# Patient Record
Sex: Male | Born: 1960 | Race: White | Hispanic: No | Marital: Single | Smoking: Never smoker
Health system: Southern US, Community
[De-identification: ages and names within clinical notes are randomized; demographics above are authoritative.]

## PROBLEM LIST (undated history)

## (undated) DIAGNOSIS — I10 Essential (primary) hypertension: Secondary | ICD-10-CM

## (undated) DIAGNOSIS — M1990 Unspecified osteoarthritis, unspecified site: Secondary | ICD-10-CM

## (undated) HISTORY — PX: TONSILLECTOMY: SUR1361

## (undated) HISTORY — PX: WRIST SURGERY: SHX841

---

## 2003-11-04 ENCOUNTER — Emergency Department (HOSPITAL_COMMUNITY): Admission: EM | Admit: 2003-11-04 | Discharge: 2003-11-05 | Payer: Self-pay | Admitting: Emergency Medicine

## 2008-04-23 ENCOUNTER — Encounter: Admission: RE | Admit: 2008-04-23 | Discharge: 2008-04-23 | Payer: Self-pay | Admitting: Podiatry

## 2008-04-23 ENCOUNTER — Encounter: Payer: Self-pay | Admitting: Podiatry

## 2008-05-14 ENCOUNTER — Emergency Department (HOSPITAL_COMMUNITY): Admission: EM | Admit: 2008-05-14 | Discharge: 2008-05-14 | Payer: Self-pay | Admitting: Emergency Medicine

## 2009-11-17 ENCOUNTER — Ambulatory Visit (HOSPITAL_COMMUNITY): Admission: RE | Admit: 2009-11-17 | Discharge: 2009-11-17 | Payer: Self-pay | Admitting: Chiropractic Medicine

## 2009-12-24 ENCOUNTER — Emergency Department (HOSPITAL_COMMUNITY)
Admission: EM | Admit: 2009-12-24 | Discharge: 2009-12-24 | Payer: Self-pay | Source: Home / Self Care | Admitting: Emergency Medicine

## 2010-03-28 ENCOUNTER — Encounter: Payer: Self-pay | Admitting: Podiatry

## 2010-06-17 LAB — POCT I-STAT, CHEM 8
BUN: 21 mg/dL (ref 6–23)
Calcium, Ion: 1.15 mmol/L (ref 1.12–1.32)
Chloride: 106 mEq/L (ref 96–112)
Creatinine, Ser: 1 mg/dL (ref 0.4–1.5)
Glucose, Bld: 108 mg/dL — ABNORMAL HIGH (ref 70–99)
HCT: 47 % (ref 39.0–52.0)
Hemoglobin: 16 g/dL (ref 13.0–17.0)
Potassium: 4 mEq/L (ref 3.5–5.1)
Sodium: 141 mEq/L (ref 135–145)
TCO2: 30 mmol/L (ref 0–100)

## 2010-06-17 LAB — POCT CARDIAC MARKERS
CKMB, poc: 3.3 ng/mL (ref 1.0–8.0)
CKMB, poc: 5.8 ng/mL (ref 1.0–8.0)
Myoglobin, poc: 100 ng/mL (ref 12–200)
Myoglobin, poc: 117 ng/mL (ref 12–200)
Troponin i, poc: <0.05 ng/mL (ref 0.00–0.09)
Troponin i, poc: <0.05 ng/mL (ref 0.00–0.09)

## 2012-11-20 ENCOUNTER — Encounter (HOSPITAL_COMMUNITY): Payer: Self-pay | Admitting: Emergency Medicine

## 2012-11-20 ENCOUNTER — Emergency Department (HOSPITAL_COMMUNITY): Payer: Self-pay

## 2012-11-20 ENCOUNTER — Emergency Department (HOSPITAL_COMMUNITY)
Admission: EM | Admit: 2012-11-20 | Discharge: 2012-11-20 | Disposition: A | Discharge status: Home / Self Care | Payer: Self-pay | Attending: Emergency Medicine | Admitting: Emergency Medicine

## 2012-11-20 DIAGNOSIS — R209 Unspecified disturbances of skin sensation: Secondary | ICD-10-CM | POA: Insufficient documentation

## 2012-11-20 DIAGNOSIS — R0602 Shortness of breath: Secondary | ICD-10-CM | POA: Insufficient documentation

## 2012-11-20 DIAGNOSIS — R42 Dizziness and giddiness: Secondary | ICD-10-CM | POA: Insufficient documentation

## 2012-11-20 DIAGNOSIS — F410 Panic disorder [episodic paroxysmal anxiety] without agoraphobia: Secondary | ICD-10-CM | POA: Insufficient documentation

## 2012-11-20 LAB — CBC WITH DIFFERENTIAL/PLATELET
Basophils Absolute: 0 10*3/uL (ref 0.0–0.1)
Basophils Relative: 0 % (ref 0–1)
Eosinophils Relative: 1 % (ref 0–5)
HCT: 45 % (ref 39.0–52.0)
Hemoglobin: 16.3 g/dL (ref 13.0–17.0)
Lymphocytes Relative: 11 % — ABNORMAL LOW (ref 12–46)
Lymphs Abs: 0.9 10*3/uL (ref 0.7–4.0)
MCH: 33.5 pg (ref 26.0–34.0)
MCHC: 36.2 g/dL — ABNORMAL HIGH (ref 30.0–36.0)
MCV: 92.6 fL (ref 78.0–100.0)
Monocytes Absolute: 0.7 10*3/uL (ref 0.1–1.0)
Neutro Abs: 6.9 10*3/uL (ref 1.7–7.7)
Neutrophils Relative %: 81 % — ABNORMAL HIGH (ref 43–77)
Platelets: 200 10*3/uL (ref 150–400)
RBC: 4.86 MIL/uL (ref 4.22–5.81)
RDW: 14.3 % (ref 11.5–15.5)

## 2012-11-20 LAB — BASIC METABOLIC PANEL
CO2: 25 mEq/L (ref 19–32)
Calcium: 9.5 mg/dL (ref 8.4–10.5)
Chloride: 100 mEq/L (ref 96–112)
Creatinine, Ser: 0.86 mg/dL (ref 0.50–1.35)
GFR calc Af Amer: >90 mL/min (ref >90)
GFR calc non Af Amer: >90 mL/min (ref >90)
Glucose, Bld: 137 mg/dL — ABNORMAL HIGH (ref 70–99)
Potassium: 4 mEq/L (ref 3.5–5.1)
Sodium: 139 mEq/L (ref 135–145)

## 2012-11-20 LAB — D-DIMER, QUANTITATIVE: D-Dimer, Quant: 0.43 ug/mL-FEU (ref 0.00–0.48)

## 2012-11-20 LAB — TROPONIN I: Troponin I: <0.3 ng/mL (ref <0.30)

## 2012-11-20 MED ORDER — LORAZEPAM 2 MG/ML IJ SOLN
1.0000 mg | Freq: Once | INTRAMUSCULAR | Status: AC
  Administered 2012-11-20: 1 mg via INTRAVENOUS
  Filled 2012-11-20: qty 1

## 2012-11-20 MED ORDER — HYDROXYZINE HCL 25 MG PO TABS: 25.0000 mg | ORAL_TABLET | Freq: Four times a day (QID) | ORAL | Status: DC

## 2012-11-20 NOTE — Progress Notes (Signed)
ED CM in to meet with patient to discuss PCP and health insurance issues. Pt presented to ED with panic attack. Pt stated, he does not have a PCP, nor health insurance at this time. Discussed the importance and benefits of having a PCP and a Medical Home. Pt verbalized understanding and agrees. Provided verbal and written information on the [facility]. Information also provided on the [facility] and the benefits and where to apply. Pt verbalized understanding. With patient's permission, will forward information to Barbaraann Boys at [facility] to schedule follow-up visit. Confirmed current phone number with patient of [phone].  Pt urged to contact [facility] if he not contacted within 7 days of discharge, he verbalized understanding.No further CM needs identified.

## 2012-11-20 NOTE — ED Notes (Signed)
Pt brought to ED by EMS with panic attack.As per EMS pt was very anxious and felt that he was going to pass out.

## 2012-11-20 NOTE — ED Provider Notes (Signed)
CSN: [id number]     Arrival date & time 11/20/12  1846 History   First MD Initiated Contact with Patient 11/20/12 1855     Chief Complaint  Patient presents with  . Panic Attack   (Consider location/radiation/quality/duration/timing/severity/associated sxs/prior Treatment) HPI Comments: Patient presents to the ER for evaluation of near syncope from what he thinks was a panic attack. Patient reports that he was at work when symptoms began. Patient reports that he started to feel very anxious and then started to have increased respiratory rate. He tried to continue his job, try to slow his breathing down but was unsuccessful. The patient reports that he had onset of numbness and tingling around his mouth, in his hands and feet. He became lightheaded, dizzy and fell he is going to pass out. He has been trying to drive to the ER, had to pullover and call EMS to bring in the rest of the way to the hospital. Patient denies any chest pain associated with the symptoms. His breathing is much better, but he still feels anxious. Patient reports that he has had previous panic attacks and this feels similar.   History reviewed. No pertinent past medical history. History reviewed. No pertinent past surgical history. No family history on file. History  Substance Use Topics  . Smoking status: Not on file  . Smokeless tobacco: Not on file  . Alcohol Use: Not on file    Review of Systems  Constitutional: Negative for fever.  Respiratory: Positive for shortness of breath.   Cardiovascular: Negative for chest pain.  Psychiatric/Behavioral: The patient is nervous/anxious.   All other systems reviewed and are negative.    Allergies  Review of patient's allergies indicates not on file.  Home Medications  No current outpatient prescriptions on file. BP 169/118  Pulse 102  Temp(Src) 97.8 F (36.6 C) (Oral)  Resp 18  SpO2 96% Physical Exam  Constitutional: He is oriented to person, place, and time.  He appears well-developed and well-nourished. No distress.  HENT:  Head: Normocephalic and atraumatic.  Right Ear: Hearing normal.  Left Ear: Hearing normal.  Nose: Nose normal.  Mouth/Throat: Oropharynx is clear and moist and mucous membranes are normal.  Eyes: Conjunctivae and EOM are normal. Pupils are equal, round, and reactive to light.  Neck: Normal range of motion. Neck supple.  Cardiovascular: Regular rhythm, S1 normal and S2 normal.  Exam reveals no gallop and no friction rub.   No murmur heard. Pulmonary/Chest: Effort normal and breath sounds normal. No respiratory distress. He exhibits no tenderness.  Abdominal: Soft. Normal appearance and bowel sounds are normal. There is no hepatosplenomegaly. There is no tenderness. There is no rebound, no guarding, no tenderness at McBurney's point and negative Murphy's sign. No hernia.  Musculoskeletal: Normal range of motion.  Neurological: He is alert and oriented to person, place, and time. He has normal strength. No cranial nerve deficit or sensory deficit. Coordination normal. GCS eye subscore is 4. GCS verbal subscore is 5. GCS motor subscore is 6.  Skin: Skin is warm, dry and intact. No rash noted. No cyanosis.  Psychiatric: He has a normal mood and affect. His speech is normal and behavior is normal. Thought content normal.    ED Course  Procedures (including critical care time)  EKG:  Date: 11/20/2012  Rate: 109  Rhythm: sinus tachycardia  QRS Axis: normal  Intervals: normal  ST/T Wave abnormalities: nonspecific ST/T changes  Conduction Disutrbances:none  Narrative Interpretation:   Old EKG Reviewed: none available  Labs Review Labs Reviewed  CBC WITH DIFFERENTIAL - Abnormal; Notable for the following:    MCHC 36.2 (*)    Neutrophils Relative % 81 (*)    Lymphocytes Relative 11 (*)    All other components within normal limits  BASIC METABOLIC PANEL - Abnormal; Notable for the following:    Glucose, Bld 137 (*)     BUN 24 (*)    All other components within normal limits  D-DIMER, QUANTITATIVE  TROPONIN I   Imaging Review Dg Chest 2 View  11/20/2012   *RADIOLOGY REPORT*  Clinical Data: Short of breath, panic attack  CHEST - 2 VIEW  Comparison: Prior chest x-ray 05/14/2008  Findings: The cardiac and mediastinal contours within normal limits.  Mild prominence of the interstitial markings appears similar compared to prior.  No focal airspace consolidation, pleural effusion or pneumothorax.  No acute osseous abnormality.  IMPRESSION: No acute cardiopulmonary process.  Stable chest x-ray.   Original Report Authenticated By: Malachy Moan, M.D.    MDM  Diagnosis: Panic attack  Patient presents to the ER for evaluation of shortness of breath, increased respiratory rate, numbness and tingling of the face and hands. Patient's symptoms are consistent with hyperventilation syndrome. He does have a history of panic attacks. Patient was improving at time of arrival. He was still hypertensive. Patient does have Ativan and has had complete resolution of symptoms. Cardiac workup negative. D-dimer negative. The patient will be discharged, [facility].   Gilda Crease, MD 11/20/12 2223

## 2012-11-23 ENCOUNTER — Telehealth: Payer: Self-pay | Admitting: General Practice

## 2013-06-25 NOTE — Telephone Encounter (Signed)
Error

## 2014-10-06 ENCOUNTER — Encounter (HOSPITAL_COMMUNITY): Payer: Self-pay | Admitting: *Deleted

## 2014-10-06 ENCOUNTER — Emergency Department (HOSPITAL_COMMUNITY)
Admission: EM | Admit: 2014-10-06 | Discharge: 2014-10-06 | Disposition: A | Discharge status: Home / Self Care | Payer: 59 | Attending: Emergency Medicine | Admitting: Emergency Medicine

## 2014-10-06 ENCOUNTER — Emergency Department (HOSPITAL_COMMUNITY): Payer: 59

## 2014-10-06 DIAGNOSIS — Z79899 Other long term (current) drug therapy: Secondary | ICD-10-CM | POA: Diagnosis not present

## 2014-10-06 DIAGNOSIS — I10 Essential (primary) hypertension: Secondary | ICD-10-CM | POA: Insufficient documentation

## 2014-10-06 DIAGNOSIS — Z7982 Long term (current) use of aspirin: Secondary | ICD-10-CM | POA: Diagnosis not present

## 2014-10-06 DIAGNOSIS — R202 Paresthesia of skin: Secondary | ICD-10-CM | POA: Diagnosis not present

## 2014-10-06 DIAGNOSIS — M546 Pain in thoracic spine: Secondary | ICD-10-CM | POA: Insufficient documentation

## 2014-10-06 HISTORY — DX: Essential (primary) hypertension: I10

## 2014-10-06 LAB — I-STAT TROPONIN, ED: Troponin i, poc: 0.01 ng/mL (ref 0.00–0.08)

## 2014-10-06 LAB — BASIC METABOLIC PANEL
Anion gap: 10 (ref 5–15)
BUN: 9 mg/dL (ref 6–20)
CHLORIDE: 100 mmol/L — AB (ref 101–111)
CO2: 24 mmol/L (ref 22–32)
Calcium: 8.2 mg/dL — ABNORMAL LOW (ref 8.9–10.3)
Creatinine, Ser: 0.62 mg/dL (ref 0.61–1.24)
GFR calc Af Amer: >60 mL/min (ref >60)
GFR calc non Af Amer: >60 mL/min (ref >60)
GLUCOSE: 113 mg/dL — AB (ref 65–99)
POTASSIUM: 4.3 mmol/L (ref 3.5–5.1)
Sodium: 134 mmol/L — ABNORMAL LOW (ref 135–145)

## 2014-10-06 LAB — CBC
HEMATOCRIT: 43.6 % (ref 39.0–52.0)
HEMOGLOBIN: 14.7 g/dL (ref 13.0–17.0)
MCH: 32.4 pg (ref 26.0–34.0)
MCHC: 33.7 g/dL (ref 30.0–36.0)
MCV: 96 fL (ref 78.0–100.0)
PLATELETS: 167 10*3/uL (ref 150–400)
RBC: 4.54 MIL/uL (ref 4.22–5.81)
RDW: 14 % (ref 11.5–15.5)
WBC: 9 10*3/uL (ref 4.0–10.5)

## 2014-10-06 MED ORDER — CYCLOBENZAPRINE HCL 10 MG PO TABS: 10.0000 mg | ORAL_TABLET | Freq: Two times a day (BID) | ORAL | Status: DC | PRN

## 2014-10-06 MED ORDER — KETOROLAC TROMETHAMINE 60 MG/2ML IM SOLN
30.0000 mg | Freq: Once | INTRAMUSCULAR | Status: AC
  Administered 2014-10-06: 30 mg via INTRAMUSCULAR
  Filled 2014-10-06: qty 2

## 2014-10-06 NOTE — Discharge Instructions (Signed)
Back Pain, Adult Low back pain is very common. About 1 in 5 people have back pain.The cause of low back pain is rarely dangerous. The pain often gets better over time.About half of people with a sudden onset of back pain feel better in just 2 weeks. About 8 in 10 people feel better by 6 weeks.  CAUSES Some common causes of back pain include:  Strain of the muscles or ligaments supporting the spine.  Wear and tear (degeneration) of the spinal discs.  Arthritis.  Direct injury to the back. DIAGNOSIS Most of the time, the direct cause of low back pain is not known.However, back pain can be treated effectively even when the exact cause of the pain is unknown.Answering your caregiver's questions about your overall health and symptoms is one of the most accurate ways to make sure the cause of your pain is not dangerous. If your caregiver needs more information, he or she may order lab work or imaging tests (X-rays or MRIs).However, even if imaging tests show changes in your back, this usually does not require surgery. HOME CARE INSTRUCTIONS For many people, back pain returns.Since low back pain is rarely dangerous, it is often a condition that people can learn to manageon their own.   Remain active. It is stressful on the back to sit or stand in one place. Do not sit, drive, or stand in one place for more than 30 minutes at a time. Take short walks on level surfaces as soon as pain allows.Try to increase the length of time you walk each day.  Do not stay in bed.Resting more than 1 or 2 days can delay your recovery.  Do not avoid exercise or work.Your body is made to move.It is not dangerous to be active, even though your back may hurt.Your back will likely heal faster if you return to being active before your pain is gone.  Pay attention to your body when you bend and lift. Many people have less discomfortwhen lifting if they bend their knees, keep the load close to their bodies,and  avoid twisting. Often, the most comfortable positions are those that put less stress on your recovering back.  Find a comfortable position to sleep. Use a firm mattress and lie on your side with your knees slightly bent. If you lie on your back, put a pillow under your knees.  Only take over-the-counter or prescription medicines as directed by your caregiver. Over-the-counter medicines to reduce pain and inflammation are often the most helpful.Your caregiver may prescribe muscle relaxant drugs.These medicines help dull your pain so you can more quickly return to your normal activities and healthy exercise.  Put ice on the injured area.  Put ice in a plastic bag.  Place a towel between your skin and the bag.  Leave the ice on for 15-20 minutes, 03-04 times a day for the first 2 to 3 days. After that, ice and heat may be alternated to reduce pain and spasms.  Ask your caregiver about trying back exercises and gentle massage. This may be of some benefit.  Avoid feeling anxious or stressed.Stress increases muscle tension and can worsen back pain.It is important to recognize when you are anxious or stressed and learn ways to manage it.Exercise is a great option. SEEK MEDICAL CARE IF:  You have pain that is not relieved with rest or medicine.  You have pain that does not improve in 1 week.  You have new symptoms.  You are generally not feeling well. SEEK   IMMEDIATE MEDICAL CARE IF:   You have pain that radiates from your back into your legs.  You develop new bowel or bladder control problems.  You have unusual weakness or numbness in your arms or legs.  You develop nausea or vomiting.  You develop abdominal pain.  You feel faint. Document Released: 02/21/2005 Document Revised: 08/23/2011 Document Reviewed: 06/25/2013 ExitCare Patient Information 2015 [employer]. This information is not intended to replace advice given to you by your health care provider. Make sure you  discuss any questions you have with your health care provider.  

## 2014-10-06 NOTE — ED Notes (Signed)
Pt to ED from home via [facility] c/o pain between shoulder blades radiating to bilateral arms. Reports pain worsens on movement, associated with numbness and tingling to fingertips. Pt reports lifting heavy furniture all week

## 2014-10-06 NOTE — ED Provider Notes (Signed)
CSN: [id number]     Arrival date & time 10/06/14  0411 History   First MD Initiated Contact with Patient 10/06/14 0413     Chief Complaint  Patient presents with  . Back Pain  . Shoulder Pain  . Neck Pain     (Consider location/radiation/quality/duration/timing/severity/associated sxs/prior Treatment) Patient is a 54 y.o. male presenting with back pain.  Back Pain Location:  Thoracic spine Quality:  Shooting and stabbing Radiates to:  Does not radiate Pain severity:  Moderate Pain is:  Same all the time Onset quality:  Gradual Duration:  4 hours Timing:  Constant Progression:  Unchanged Chronicity:  New Context: lifting heavy objects   Context comment:  Lifting furniture over last day Relieved by:  Nothing Worsened by:  Movement, twisting and touching Ineffective treatments:  None tried Associated symptoms: tingling (fingertips)   Associated symptoms: no abdominal pain and no fever     Past Medical History  Diagnosis Date  . Hypertension    Past Surgical History  Procedure Laterality Date  . Wrist surgery     History reviewed. No pertinent family history. History  Substance Use Topics  . Smoking status: Never Smoker   . Smokeless tobacco: Not on file  . Alcohol Use: Yes    Review of Systems  Constitutional: Negative for fever.  Gastrointestinal: Negative for abdominal pain.  Musculoskeletal: Positive for back pain.  Neurological: Positive for tingling (fingertips).  All other systems reviewed and are negative.     Allergies  Review of patient's allergies indicates no known allergies.  Home Medications   Prior to Admission medications   Medication Sig Start Date End Date Taking? Authorizing Provider  aspirin 325 MG tablet Take 325 mg by mouth every 4 (four) hours as needed for pain.    Historical Provider, MD  cyclobenzaprine (FLEXERIL) 10 MG tablet Take 1 tablet (10 mg total) by mouth 2 (two) times daily as needed for muscle spasms. 10/06/14   Mirian Mo, MD  hydrOXYzine (ATARAX/VISTARIL) 25 MG tablet Take 1 tablet (25 mg total) by mouth every 6 (six) hours. 11/20/12   Gilda Crease, MD  Multiple Vitamins-Minerals (MULTIVITAMIN WITH MINERALS) tablet Take 1 tablet by mouth daily.    Historical Provider, MD   BP 139/73 mmHg  Pulse 73  Temp(Src) 97.6 F (36.4 C) (Oral)  Resp 15  SpO2 92% Physical Exam  Constitutional: He is oriented to person, place, and time. He appears well-developed and well-nourished.  HENT:  Head: Normocephalic and atraumatic.  Eyes: Conjunctivae and EOM are normal.  Neck: Normal range of motion. Neck supple.  Cardiovascular: Normal rate, regular rhythm and normal heart sounds.   Pulmonary/Chest: Effort normal and breath sounds normal. No respiratory distress.  Abdominal: He exhibits no distension. There is no tenderness. There is no rebound and no guarding.  Musculoskeletal: Normal range of motion.  Neurological: He is alert and oriented to person, place, and time. He has normal strength. No sensory deficit.  Skin: Skin is warm and dry.  Vitals reviewed.   ED Course  Procedures (including critical care time) Labs Review Labs Reviewed  BASIC METABOLIC PANEL - Abnormal; Notable for the following:    Sodium 134 (*)    Chloride 100 (*)    Glucose, Bld 113 (*)    Calcium 8.2 (*)    All other components within normal limits  CBC  I-STAT TROPOININ, ED    Imaging Review Dg Chest 2 View  10/06/2014   CLINICAL DATA:  Left arm  paresthesias.  Neck and back pain.  EXAM: CHEST  2 VIEW  COMPARISON:  11/20/2012  FINDINGS: The heart size and mediastinal contours are within normal limits. Both lungs are clear. The visualized skeletal structures are unremarkable.  IMPRESSION: No active cardiopulmonary disease.   Electronically Signed   By: Ellery Plunk M.D.   On: 10/06/2014 05:03     EKG Interpretation   Date/Time:  Monday October 06 2014 04:16:37 EDT Ventricular Rate:  74 PR Interval:  213 QRS  Duration: 93 QT Interval:  395 QTC Calculation: 438 R Axis:   84 Text Interpretation:  Sinus rhythm Prolonged PR interval rate has  decreased since last tracing Confirmed by Mirian Mo [phone]) on  10/06/2014 4:22:11 AM      MDM   Final diagnoses:  Midline thoracic back pain    54 y.o. male with pertinent PMH of HTN presents with back pain as above. Pain worse with movement, occurs in context of heavy lifting over the last few days. He has a history of prior back pain with bony spurs. He denies chest pain, dyspnea, other systemic symptoms. Pain is worse with movement. Not worse with breathing.  On arrival vital signs and physical exam as above. I'm unable to reproduce pain with palpation, however the patient does have pain with movement. Symptoms began approximately 5 hours prior to arrival.  Workup as above unremarkable. Considered likely musculoskeletal however also consider differential of coronary embolism, aortic dissection, ACS. Given clinical history, workup as above, exam consider these unremarkable. Discharged home in stable condition.    I have reviewed all laboratory and imaging studies if ordered as above  1. Midline thoracic back pain         Mirian Mo, MD 10/06/14 [phone]

## 2014-10-09 ENCOUNTER — Ambulatory Visit: Payer: 59

## 2014-10-10 ENCOUNTER — Emergency Department (HOSPITAL_COMMUNITY): Payer: 59

## 2014-10-10 ENCOUNTER — Emergency Department (HOSPITAL_COMMUNITY): Payer: 59 | Admitting: Anesthesiology

## 2014-10-10 ENCOUNTER — Encounter (HOSPITAL_COMMUNITY): Payer: Self-pay | Admitting: Emergency Medicine

## 2014-10-10 ENCOUNTER — Inpatient Hospital Stay (HOSPITAL_COMMUNITY)
Admission: EM | Admit: 2014-10-10 | Discharge: 2014-10-12 | DRG: 473 | Disposition: A | Discharge status: Home / Self Care | Payer: 59 | Attending: Neurosurgery | Admitting: Neurosurgery

## 2014-10-10 ENCOUNTER — Encounter (HOSPITAL_COMMUNITY)
Admission: EM | Disposition: A | Discharge status: Home / Self Care | Payer: Self-pay | Source: Home / Self Care | Attending: Neurosurgery

## 2014-10-10 DIAGNOSIS — I10 Essential (primary) hypertension: Secondary | ICD-10-CM | POA: Diagnosis present

## 2014-10-10 DIAGNOSIS — S134XXA Sprain of ligaments of cervical spine, initial encounter: Secondary | ICD-10-CM

## 2014-10-10 DIAGNOSIS — M5012 Cervical disc disorder with radiculopathy, mid-cervical region: Secondary | ICD-10-CM | POA: Diagnosis present

## 2014-10-10 DIAGNOSIS — Y9289 Other specified places as the place of occurrence of the external cause: Secondary | ICD-10-CM

## 2014-10-10 DIAGNOSIS — M542 Cervicalgia: Secondary | ICD-10-CM | POA: Diagnosis present

## 2014-10-10 DIAGNOSIS — Y998 Other external cause status: Secondary | ICD-10-CM

## 2014-10-10 DIAGNOSIS — W010XXA Fall on same level from slipping, tripping and stumbling without subsequent striking against object, initial encounter: Secondary | ICD-10-CM | POA: Diagnosis present

## 2014-10-10 DIAGNOSIS — M532X2 Spinal instabilities, cervical region: Secondary | ICD-10-CM | POA: Diagnosis present

## 2014-10-10 DIAGNOSIS — Z419 Encounter for procedure for purposes other than remedying health state, unspecified: Secondary | ICD-10-CM

## 2014-10-10 DIAGNOSIS — M4722 Other spondylosis with radiculopathy, cervical region: Secondary | ICD-10-CM | POA: Diagnosis present

## 2014-10-10 HISTORY — PX: ANTERIOR CERVICAL DECOMP/DISCECTOMY FUSION: SHX1161

## 2014-10-10 LAB — BASIC METABOLIC PANEL
Anion gap: 10 (ref 5–15)
BUN: 12 mg/dL (ref 6–20)
CHLORIDE: 101 mmol/L (ref 101–111)
CO2: 25 mmol/L (ref 22–32)
Calcium: 8.7 mg/dL — ABNORMAL LOW (ref 8.9–10.3)
Creatinine, Ser: 0.74 mg/dL (ref 0.61–1.24)
GLUCOSE: 97 mg/dL (ref 65–99)
Potassium: 3.5 mmol/L (ref 3.5–5.1)
Sodium: 136 mmol/L (ref 135–145)

## 2014-10-10 LAB — CBC WITH DIFFERENTIAL/PLATELET
BASOS PCT: 0 % (ref 0–1)
Basophils Absolute: 0 10*3/uL (ref 0.0–0.1)
EOS ABS: 0.1 10*3/uL (ref 0.0–0.7)
EOS PCT: 2 % (ref 0–5)
HCT: 43.6 % (ref 39.0–52.0)
Hemoglobin: 15.1 g/dL (ref 13.0–17.0)
LYMPHS ABS: 1.5 10*3/uL (ref 0.7–4.0)
Lymphocytes Relative: 21 % (ref 12–46)
MCH: 32.7 pg (ref 26.0–34.0)
MCHC: 34.6 g/dL (ref 30.0–36.0)
MCV: 94.4 fL (ref 78.0–100.0)
MONOS PCT: 10 % (ref 3–12)
Monocytes Absolute: 0.7 10*3/uL (ref 0.1–1.0)
NEUTROS PCT: 67 % (ref 43–77)
Neutro Abs: 4.7 10*3/uL (ref 1.7–7.7)
Platelets: 178 10*3/uL (ref 150–400)
RBC: 4.62 MIL/uL (ref 4.22–5.81)
RDW: 13.7 % (ref 11.5–15.5)
WBC: 7.1 10*3/uL (ref 4.0–10.5)

## 2014-10-10 SURGERY — ANTERIOR CERVICAL DECOMPRESSION/DISCECTOMY FUSION 1 LEVEL
Anesthesia: General | Site: Neck | Laterality: Bilateral

## 2014-10-10 MED ORDER — BUPIVACAINE HCL (PF) 0.5 % IJ SOLN
INTRAMUSCULAR | Status: DC | PRN
  Administered 2014-10-10: 5 mL

## 2014-10-10 MED ORDER — SODIUM CHLORIDE 0.9 % IJ SOLN: 3.0000 mL | INTRAMUSCULAR | Status: DC | PRN

## 2014-10-10 MED ORDER — ONDANSETRON HCL 4 MG/2ML IJ SOLN
INTRAMUSCULAR | Status: AC
  Filled 2014-10-10: qty 2

## 2014-10-10 MED ORDER — SODIUM CHLORIDE 0.9 % IJ SOLN
3.0000 mL | Freq: Two times a day (BID) | INTRAMUSCULAR | Status: DC
  Administered 2014-10-10 – 2014-10-12 (×3): 3 mL via INTRAVENOUS

## 2014-10-10 MED ORDER — NEOSTIGMINE METHYLSULFATE 10 MG/10ML IV SOLN
INTRAVENOUS | Status: DC | PRN
  Administered 2014-10-10: 4 mg via INTRAVENOUS

## 2014-10-10 MED ORDER — KCL IN DEXTROSE-NACL 20-5-0.45 MEQ/L-%-% IV SOLN
INTRAVENOUS | Status: DC
  Administered 2014-10-10: 22:00:00 via INTRAVENOUS
  Filled 2014-10-10: qty 1000

## 2014-10-10 MED ORDER — CEFAZOLIN SODIUM-DEXTROSE 2-3 GM-% IV SOLR
INTRAVENOUS | Status: DC | PRN
  Administered 2014-10-10: 2 g via INTRAVENOUS

## 2014-10-10 MED ORDER — MORPHINE SULFATE 4 MG/ML IJ SOLN
4.0000 mg | INTRAMUSCULAR | Status: DC | PRN
  Administered 2014-10-11 (×2): 4 mg via INTRAMUSCULAR
  Filled 2014-10-10 (×2): qty 1

## 2014-10-10 MED ORDER — SODIUM CHLORIDE 0.9 % IR SOLN
Status: DC | PRN
  Administered 2014-10-10: 500 mL

## 2014-10-10 MED ORDER — MENTHOL 3 MG MT LOZG: 1.0000 | LOZENGE | OROMUCOSAL | Status: DC | PRN

## 2014-10-10 MED ORDER — MIDAZOLAM HCL 2 MG/2ML IJ SOLN
INTRAMUSCULAR | Status: AC
  Filled 2014-10-10: qty 4

## 2014-10-10 MED ORDER — HYDROXYZINE HCL 50 MG/ML IM SOLN: 50.0000 mg | INTRAMUSCULAR | Status: DC | PRN

## 2014-10-10 MED ORDER — ROCURONIUM BROMIDE 100 MG/10ML IV SOLN
INTRAVENOUS | Status: DC | PRN
  Administered 2014-10-10: 10 mg via INTRAVENOUS
  Administered 2014-10-10: 50 mg via INTRAVENOUS

## 2014-10-10 MED ORDER — HYDROMORPHONE HCL 1 MG/ML IJ SOLN
0.2500 mg | INTRAMUSCULAR | Status: DC | PRN
  Administered 2014-10-10: 0.5 mg via INTRAVENOUS

## 2014-10-10 MED ORDER — PROPOFOL 10 MG/ML IV BOLUS
INTRAVENOUS | Status: DC | PRN
  Administered 2014-10-10: 200 mg via INTRAVENOUS

## 2014-10-10 MED ORDER — MIDAZOLAM HCL 2 MG/2ML IJ SOLN
INTRAMUSCULAR | Status: DC | PRN
  Administered 2014-10-10: 2 mg via INTRAVENOUS

## 2014-10-10 MED ORDER — LACTATED RINGERS IV SOLN
INTRAVENOUS | Status: DC | PRN
  Administered 2014-10-10: 20:00:00 via INTRAVENOUS

## 2014-10-10 MED ORDER — SODIUM CHLORIDE 0.9 % IV SOLN: 250.0000 mL | INTRAVENOUS | Status: DC

## 2014-10-10 MED ORDER — MAGNESIUM HYDROXIDE 400 MG/5ML PO SUSP: 30.0000 mL | Freq: Every day | ORAL | Status: DC | PRN

## 2014-10-10 MED ORDER — ROCURONIUM BROMIDE 50 MG/5ML IV SOLN
INTRAVENOUS | Status: AC
  Filled 2014-10-10: qty 1

## 2014-10-10 MED ORDER — FENTANYL CITRATE (PF) 250 MCG/5ML IJ SOLN
INTRAMUSCULAR | Status: AC
  Filled 2014-10-10: qty 5

## 2014-10-10 MED ORDER — LIDOCAINE HCL (CARDIAC) 20 MG/ML IV SOLN
INTRAVENOUS | Status: AC
  Filled 2014-10-10: qty 5

## 2014-10-10 MED ORDER — HYDROCODONE-ACETAMINOPHEN 5-325 MG PO TABS: 1.0000 | ORAL_TABLET | ORAL | Status: DC | PRN

## 2014-10-10 MED ORDER — ONDANSETRON HCL 4 MG PO TABS: 4.0000 mg | ORAL_TABLET | Freq: Four times a day (QID) | ORAL | Status: DC | PRN

## 2014-10-10 MED ORDER — DEXAMETHASONE SODIUM PHOSPHATE 10 MG/ML IJ SOLN
INTRAMUSCULAR | Status: DC | PRN
  Administered 2014-10-10: 10 mg via INTRAVENOUS

## 2014-10-10 MED ORDER — HYDROXYZINE HCL 25 MG PO TABS: 50.0000 mg | ORAL_TABLET | ORAL | Status: DC | PRN

## 2014-10-10 MED ORDER — BISACODYL 10 MG RE SUPP: 10.0000 mg | Freq: Every day | RECTAL | Status: DC | PRN

## 2014-10-10 MED ORDER — LACTATED RINGERS IV SOLN
INTRAVENOUS | Status: DC | PRN
  Administered 2014-10-10 (×2): via INTRAVENOUS

## 2014-10-10 MED ORDER — PHENOL 1.4 % MT LIQD: 1.0000 | OROMUCOSAL | Status: DC | PRN

## 2014-10-10 MED ORDER — HYDROMORPHONE HCL 1 MG/ML IJ SOLN
INTRAMUSCULAR | Status: AC
  Filled 2014-10-10: qty 1

## 2014-10-10 MED ORDER — GLYCOPYRROLATE 0.2 MG/ML IJ SOLN
INTRAMUSCULAR | Status: DC | PRN
  Administered 2014-10-10: 0.6 mg via INTRAVENOUS

## 2014-10-10 MED ORDER — KETOROLAC TROMETHAMINE 30 MG/ML IJ SOLN
30.0000 mg | Freq: Four times a day (QID) | INTRAMUSCULAR | Status: DC
  Administered 2014-10-11 – 2014-10-12 (×5): 30 mg via INTRAVENOUS
  Filled 2014-10-10 (×5): qty 1

## 2014-10-10 MED ORDER — DIAZEPAM 5 MG PO TABS
5.0000 mg | ORAL_TABLET | Freq: Once | ORAL | Status: AC
  Administered 2014-10-10: 5 mg via ORAL
  Filled 2014-10-10: qty 1

## 2014-10-10 MED ORDER — LIDOCAINE-EPINEPHRINE 1 %-1:100000 IJ SOLN
INTRAMUSCULAR | Status: DC | PRN
  Administered 2014-10-10: 5 mL

## 2014-10-10 MED ORDER — THROMBIN 5000 UNITS EX SOLR
OROMUCOSAL | Status: DC | PRN
  Administered 2014-10-10: 5 mL via TOPICAL

## 2014-10-10 MED ORDER — EPHEDRINE SULFATE 50 MG/ML IJ SOLN
INTRAMUSCULAR | Status: DC | PRN
  Administered 2014-10-10: 10 mg via INTRAVENOUS
  Administered 2014-10-10: 15 mg via INTRAVENOUS
  Administered 2014-10-10: 10 mg via INTRAVENOUS

## 2014-10-10 MED ORDER — THROMBIN 20000 UNITS EX SOLR
CUTANEOUS | Status: DC | PRN
  Administered 2014-10-10: 20 mL via TOPICAL

## 2014-10-10 MED ORDER — CYCLOBENZAPRINE HCL 10 MG PO TABS
10.0000 mg | ORAL_TABLET | Freq: Three times a day (TID) | ORAL | Status: DC | PRN
  Administered 2014-10-11: 10 mg via ORAL
  Filled 2014-10-10: qty 1

## 2014-10-10 MED ORDER — ACETAMINOPHEN 10 MG/ML IV SOLN
INTRAVENOUS | Status: AC
  Administered 2014-10-10: 1000 mg via INTRAVENOUS
  Filled 2014-10-10: qty 100

## 2014-10-10 MED ORDER — NEOSTIGMINE METHYLSULFATE 10 MG/10ML IV SOLN
INTRAVENOUS | Status: AC
  Filled 2014-10-10: qty 1

## 2014-10-10 MED ORDER — PROMETHAZINE HCL 25 MG/ML IJ SOLN: 6.2500 mg | INTRAMUSCULAR | Status: DC | PRN

## 2014-10-10 MED ORDER — FENTANYL CITRATE (PF) 250 MCG/5ML IJ SOLN
INTRAMUSCULAR | Status: DC | PRN
  Administered 2014-10-10 (×2): 100 ug via INTRAVENOUS
  Administered 2014-10-10: 50 ug via INTRAVENOUS

## 2014-10-10 MED ORDER — PROPOFOL 10 MG/ML IV BOLUS
INTRAVENOUS | Status: AC
  Filled 2014-10-10: qty 20

## 2014-10-10 MED ORDER — HYDROMORPHONE HCL 1 MG/ML IJ SOLN
1.0000 mg | Freq: Once | INTRAMUSCULAR | Status: AC
  Administered 2014-10-10: 1 mg via INTRAVENOUS
  Filled 2014-10-10: qty 1

## 2014-10-10 MED ORDER — HYDROXYZINE HCL 50 MG/ML IM SOLN
50.0000 mg | INTRAMUSCULAR | Status: DC | PRN
  Filled 2014-10-10: qty 1

## 2014-10-10 MED ORDER — GLYCOPYRROLATE 0.2 MG/ML IJ SOLN
INTRAMUSCULAR | Status: AC
  Filled 2014-10-10: qty 3

## 2014-10-10 MED ORDER — PHENYLEPHRINE HCL 10 MG/ML IJ SOLN
INTRAMUSCULAR | Status: DC | PRN
  Administered 2014-10-10 (×4): 80 ug via INTRAVENOUS

## 2014-10-10 MED ORDER — OXYCODONE-ACETAMINOPHEN 5-325 MG PO TABS
1.0000 | ORAL_TABLET | ORAL | Status: DC | PRN
  Administered 2014-10-11 (×3): 2 via ORAL
  Filled 2014-10-10 (×4): qty 2

## 2014-10-10 MED ORDER — HYDROMORPHONE HCL 1 MG/ML IJ SOLN
1.0000 mg | Freq: Once | INTRAMUSCULAR | Status: AC
  Administered 2014-10-10: 1 mg via INTRAMUSCULAR
  Filled 2014-10-10: qty 1

## 2014-10-10 MED ORDER — ALUM & MAG HYDROXIDE-SIMETH 200-200-20 MG/5ML PO SUSP: 30.0000 mL | Freq: Four times a day (QID) | ORAL | Status: DC | PRN

## 2014-10-10 MED ORDER — SODIUM CHLORIDE 0.9 % IV BOLUS (SEPSIS): 1000.0000 mL | Freq: Once | INTRAVENOUS | Status: DC

## 2014-10-10 MED ORDER — DEXAMETHASONE SODIUM PHOSPHATE 4 MG/ML IJ SOLN
INTRAMUSCULAR | Status: AC
  Filled 2014-10-10: qty 2

## 2014-10-10 MED ORDER — KETOROLAC TROMETHAMINE 30 MG/ML IJ SOLN
INTRAMUSCULAR | Status: AC
  Filled 2014-10-10: qty 1

## 2014-10-10 MED ORDER — KETOROLAC TROMETHAMINE 30 MG/ML IJ SOLN
30.0000 mg | Freq: Once | INTRAMUSCULAR | Status: AC
  Administered 2014-10-10: 30 mg via INTRAVENOUS

## 2014-10-10 MED ORDER — ACETAMINOPHEN 325 MG PO TABS: 650.0000 mg | ORAL_TABLET | ORAL | Status: DC | PRN

## 2014-10-10 MED ORDER — ACETAMINOPHEN 650 MG RE SUPP: 650.0000 mg | RECTAL | Status: DC | PRN

## 2014-10-10 MED ORDER — LIDOCAINE HCL (CARDIAC) 20 MG/ML IV SOLN
INTRAVENOUS | Status: DC | PRN
  Administered 2014-10-10: 40 mg via INTRAVENOUS

## 2014-10-10 MED ORDER — SODIUM CHLORIDE 0.9 % IV SOLN
INTRAVENOUS | Status: DC | PRN
  Administered 2014-10-10: 18:00:00 via INTRAVENOUS

## 2014-10-10 MED ORDER — ONDANSETRON HCL 4 MG/2ML IJ SOLN: 4.0000 mg | Freq: Four times a day (QID) | INTRAMUSCULAR | Status: DC | PRN

## 2014-10-10 MED ORDER — SODIUM CHLORIDE 0.9 % IV SOLN
Freq: Once | INTRAVENOUS | Status: AC
  Administered 2014-10-10: 17:00:00 via INTRAVENOUS

## 2014-10-10 SURGICAL SUPPLY — 78 items
BAG DECANTER FOR FLEXI CONT (MISCELLANEOUS) ×6 IMPLANT
BIT DRILL NEURO 2X3.1 SFT TUCH (MISCELLANEOUS) ×2 IMPLANT
BIT DRILL WIRE PASS 1.3MM (BIT) IMPLANT
BLADE CLIPPER SURG (BLADE) IMPLANT
BLADE ULTRA TIP 2M (BLADE) ×3 IMPLANT
BRUSH SCRUB EZ PLAIN DRY (MISCELLANEOUS) ×6 IMPLANT
CANISTER SUCT 3000ML PPV (MISCELLANEOUS) ×3 IMPLANT
COVER BACK TABLE 60X90IN (DRAPES) ×3 IMPLANT
COVER MAYO STAND STRL (DRAPES) ×3 IMPLANT
DECANTER SPIKE VIAL GLASS SM (MISCELLANEOUS) ×3 IMPLANT
DERMABOND ADHESIVE PROPEN (GAUZE/BANDAGES/DRESSINGS) ×1
DERMABOND ADVANCED (GAUZE/BANDAGES/DRESSINGS)
DERMABOND ADVANCED .7 DNX12 (GAUZE/BANDAGES/DRESSINGS) IMPLANT
DERMABOND ADVANCED .7 DNX6 (GAUZE/BANDAGES/DRESSINGS) ×2 IMPLANT
DRAPE C-ARM 42X72 X-RAY (DRAPES) IMPLANT
DRAPE LAPAROTOMY 100X72 PEDS (DRAPES) ×6 IMPLANT
DRAPE MICROSCOPE LEICA (MISCELLANEOUS) ×3 IMPLANT
DRAPE POUCH INSTRU U-SHP 10X18 (DRAPES) ×6 IMPLANT
DRAPE PROXIMA HALF (DRAPES) IMPLANT
DRILL NEURO 2X3.1 SOFT TOUCH (MISCELLANEOUS) ×3
DRILL WIRE PASS 1.3MM (BIT)
DRSG EMULSION OIL 3X3 NADH (GAUZE/BANDAGES/DRESSINGS) IMPLANT
ELECT COATED BLADE 2.86 ST (ELECTRODE) ×3 IMPLANT
ELECT REM PT RETURN 9FT ADLT (ELECTROSURGICAL) ×3
ELECTRODE REM PT RTRN 9FT ADLT (ELECTROSURGICAL) ×2 IMPLANT
EVACUATOR 1/8 PVC DRAIN (DRAIN) IMPLANT
GAUZE SPONGE 4X4 12PLY STRL (GAUZE/BANDAGES/DRESSINGS) IMPLANT
GAUZE SPONGE 4X4 16PLY XRAY LF (GAUZE/BANDAGES/DRESSINGS) IMPLANT
GLOVE BIO SURGEON STRL SZ 6.5 (GLOVE) ×6 IMPLANT
GLOVE BIO SURGEON STRL SZ7 (GLOVE) ×9 IMPLANT
GLOVE BIOGEL PI IND STRL 8 (GLOVE) ×2 IMPLANT
GLOVE BIOGEL PI INDICATOR 8 (GLOVE) ×1
GLOVE ECLIPSE 6.5 STRL STRAW (GLOVE) ×3 IMPLANT
GLOVE ECLIPSE 7.5 STRL STRAW (GLOVE) ×3 IMPLANT
GLOVE EXAM NITRILE LRG STRL (GLOVE) IMPLANT
GLOVE EXAM NITRILE MD LF STRL (GLOVE) IMPLANT
GLOVE EXAM NITRILE XL STR (GLOVE) IMPLANT
GLOVE EXAM NITRILE XS STR PU (GLOVE) IMPLANT
GLOVE INDICATOR 6.5 STRL GRN (GLOVE) ×3 IMPLANT
GLOVE INDICATOR 7.5 STRL GRN (GLOVE) ×3 IMPLANT
GOWN STRL REUS W/ TWL LRG LVL3 (GOWN DISPOSABLE) ×6 IMPLANT
GOWN STRL REUS W/ TWL XL LVL3 (GOWN DISPOSABLE) IMPLANT
GOWN STRL REUS W/TWL 2XL LVL3 (GOWN DISPOSABLE) IMPLANT
GOWN STRL REUS W/TWL LRG LVL3 (GOWN DISPOSABLE) ×3
GOWN STRL REUS W/TWL XL LVL3 (GOWN DISPOSABLE)
GRAFT CORT CANC 14X8.25X11 5D (Bone Implant) ×3 IMPLANT
HALTER HD/CHIN CERV TRACTION D (MISCELLANEOUS) ×3 IMPLANT
HEMOSTAT POWDER KIT SURGIFOAM (HEMOSTASIS) ×3 IMPLANT
HEMOSTAT SURGICEL 2X14 (HEMOSTASIS) IMPLANT
KIT BASIN OR (CUSTOM PROCEDURE TRAY) ×3 IMPLANT
KIT ROOM TURNOVER OR (KITS) ×3 IMPLANT
NEEDLE HYPO 25X1 1.5 SAFETY (NEEDLE) ×3 IMPLANT
NEEDLE SPNL 18GX3.5 QUINCKE PK (NEEDLE) IMPLANT
NEEDLE SPNL 22GX3.5 QUINCKE BK (NEEDLE) ×3 IMPLANT
NS IRRIG 1000ML POUR BTL (IV SOLUTION) ×3 IMPLANT
PACK LAMINECTOMY NEURO (CUSTOM PROCEDURE TRAY) ×3 IMPLANT
PAD ARMBOARD 7.5X6 YLW CONV (MISCELLANEOUS) ×9 IMPLANT
PIN MAYFIELD SKULL DISP (PIN) IMPLANT
PLATE CERVICAL 16MM (Plate) ×3 IMPLANT
RUBBERBAND STERILE (MISCELLANEOUS) ×6 IMPLANT
SCREW FIX 4.0X15MM (Screw) ×6 IMPLANT
SCREW VAR 4.0X16MM (Screw) ×6 IMPLANT
SPONGE INTESTINAL PEANUT (DISPOSABLE) ×3 IMPLANT
SPONGE LAP 4X18 X RAY DECT (DISPOSABLE) IMPLANT
SPONGE SURGIFOAM ABS GEL 100 (HEMOSTASIS) IMPLANT
SPONGE SURGIFOAM ABS GEL SZ50 (HEMOSTASIS) ×3 IMPLANT
STAPLER SKIN PROX WIDE 3.9 (STAPLE) IMPLANT
SUT ETHILON 3 0 FSL (SUTURE) IMPLANT
SUT VIC AB 0 CT1 18XCR BRD8 (SUTURE) IMPLANT
SUT VIC AB 0 CT1 8-18 (SUTURE)
SUT VIC AB 2-0 CP2 18 (SUTURE) ×3 IMPLANT
SUT VIC AB 3-0 SH 8-18 (SUTURE) ×3 IMPLANT
SYR 20ML ECCENTRIC (SYRINGE) ×3 IMPLANT
TOWEL OR 17X24 6PK STRL BLUE (TOWEL DISPOSABLE) IMPLANT
TOWEL OR 17X26 10 PK STRL BLUE (TOWEL DISPOSABLE) ×3 IMPLANT
TRAY FOLEY W/METER SILVER 14FR (SET/KITS/TRAYS/PACK) IMPLANT
UNDERPAD 30X30 INCONTINENT (UNDERPADS AND DIAPERS) IMPLANT
WATER STERILE IRR 1000ML POUR (IV SOLUTION) ×3 IMPLANT

## 2014-10-10 NOTE — Anesthesia Procedure Notes (Signed)
Procedure Name: Intubation Date/Time: 10/10/2014 6:33 PM Performed by: Trixie Deis A Pre-anesthesia Checklist: Patient identified, Timeout performed, Emergency Drugs available, Suction available and Patient being monitored Patient Re-evaluated:Patient Re-evaluated prior to inductionOxygen Delivery Method: Circle system utilized Preoxygenation: Pre-oxygenation with 100% oxygen Intubation Type: IV induction Ventilation: Mask ventilation without difficulty and Oral airway inserted - appropriate to patient size Grade View: Grade I Tube type: Oral Tube size: 7.5 mm Number of attempts: 1 Airway Equipment and Method: Rigid stylet,  LTA kit utilized and Video-laryngoscopy (pt in C-collar ) Placement Confirmation: ETT inserted through vocal cords under direct vision,  breath sounds checked- equal and bilateral and positive ETCO2 Secured at: 22 cm Tube secured with: Tape Dental Injury: Teeth and Oropharynx as per pre-operative assessment  Difficulty Due To: Difficulty was anticipated and Difficult Airway-  due to neck instability

## 2014-10-10 NOTE — H&P (Signed)
Subjective: Patient is a 54 y.o. right-handed white male who is admitted for treatment of acute/subacute cervical spine injury.  Patient was moving boxes from one storage unit to another 5 nights ago and tripped over the boxes and fell, hitting the back of his head on some boxes. He went home, but later that evening at about 0100 he awoke with severe pain in the upper back, that extended to his shoulders. He was uncomfortable enough that he called EMS and was brought to the [facility] emergency room and evaluated. No x-rays were done, and he was discharged to home. As the week went on the pain extended up into his posterior neck. He went to [facility] yesterday and again was evaluated. No x-rays were done, but the primary physician suspected the problems in his neck, according to the patient.  The cause of continued difficulties, he will return to the [facility] emergency room today, and was evaluated by Jaynie Crumble, PA-C, who found that he had weakness in the right upper extremity, and obtained x-rays and MRI scan of the cervical spine. 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 No alteration of cord signal is seen. Neurosurgical consultation was requested.  Patient's explains that in addition to the pain extending up to the posterior neck, he found the pain became worse with being upright, and was particularly aggravated by walking. The pain extended to the right upper extremity with numbness running into the right upper extremity and into the digits the right hand, particularly into the right first digit, and to a lesser symptoms into the right second digit. He has a sense of weakness in the right upper extremity and he describes tingling in the right lower extremity.   Past Medical History  Diagnosis Date  . Hypertension      Past Surgical History  Procedure Laterality Date  . Wrist surgery       (Not in a hospital admission) No Known Allergies  History  Substance Use Topics  . Smoking status: Never Smoker   . Smokeless tobacco: Not on file  . Alcohol Use: Yes    History reviewed. No pertinent family history.   Review of Systems A comprehensive review of systems was negative.  Objective: Vital signs in last 24 hours: Temp:  [98.5 F (36.9 C)] 98.5 F (36.9 C) (08/05 1214) Pulse Rate:  [104] 104 (08/05 1214) Resp:  [16] 16 (08/05 1214) BP: (120)/(97) 120/97 mmHg (08/05 1214) SpO2:  [98 %] 98 % (08/05 1214) Weight:  [95.255 kg (210 lb)] 95.255 kg (210 lb) (08/05 1214)  EXAM: Patient is a well-developed well-nourished white male, immobilized in a Aspen cervical collar, in no acute distress. Lungs are clear to auscultation , the patient has symmetrical respiratory excursion. Heart has a regular rate and rhythm normal S1 and S2 no murmur.   Abdomen is soft nontender nondistended bowel sounds are present. Extremity examination shows no clubbing cyanosis or edema. Motor examination shows 5 over 5 strength in the upper extremities other than for the right biceps which is 3/5, including the deltoid bilaterally, the left biceps, and the triceps, intrinsics, and grip. Sensation is intact to pinprick throughout the digits of the upper extremities. Reflexes are minimal to absent in the upper and lower extremities, and symmetrical and without evidence of pathologic reflexes. Gait and stance are not tested due to  the nature of the patient's condition.  Data Review:CBC    Component Value Date/Time   WBC 9.0 10/06/2014 0429   RBC 4.54 10/06/2014 0429   HGB 14.7 10/06/2014 0429   HCT 43.6 10/06/2014 0429   PLT 167 10/06/2014 0429   MCV 96.0 10/06/2014 0429   MCH 32.4 10/06/2014 0429   MCHC 33.7 10/06/2014 0429   RDW 14.0 10/06/2014 0429   LYMPHSABS 0.9 11/20/2012 1902   MONOABS 0.7 11/20/2012 1902   EOSABS  0.1 11/20/2012 1902   BASOSABS 0.0 11/20/2012 1902                          BMET    Component Value Date/Time   NA 134* 10/06/2014 0429   K 4.3 10/06/2014 0429   CL 100* 10/06/2014 0429   CO2 24 10/06/2014 0429   GLUCOSE 113* 10/06/2014 0429   BUN 9 10/06/2014 0429   CREATININE 0.62 10/06/2014 0429   CALCIUM 8.2* 10/06/2014 0429   GFRNONAA >60 10/06/2014 0429   GFRAA >60 10/06/2014 0429     Assessment/Plan: Patient who presents with increasing neck pain and right cervical radicular pain and numbness, with weakness of the right biceps on examination, who is been found to have suffered a severe cervical spine injury 5 days ago when he fell. He has acute angulation at the C5-6 level, with disruption of his posterior ligamentous structures, as well as disruption of his C5-6 cervical disc, with associated disc herniation with spinal cord and nerve root compression. He is admitted from the emergency room, to be taken to surgery for decompression and stabilization via both an anterior decompression and stabilization and a posterior stabilization:  Specifically a C5-6 anterior cervical decompression arthrodesis with structural allograft and cervical plating and a C5-6 posterior cervical arthrodesis with posterior instrumentation and bone graft. I discussed the nature the patient's condition and the indication for surgical intervention with the patient, and I discussed the nature the surgical procedure, to the surgery, hospital stay, and recuperation. We discussed risks surgical including risks of infection, bleeding, possibly need for transfusion, the risk of spinal cord dysfunction, with paralysis of all 4 limbs and quadriplegia, the risk of nerve root dysfunction, with weakness, numbness, tingling, or pain, the risk of failure of the arthrodesis and possible need for further surgery, and risks of esophageal and/or laryngeal dysfunction with difficulties swallowing or hoarseness of the voice, and  anesthetic risks of myocardial infarction, stroke, pneumonia, and death. Understanding all this the patient wishes to proceed with surgery and is admitted for such.   Hewitt Shorts, MD 10/10/2014 5:03 PM

## 2014-10-10 NOTE — ED Notes (Signed)
Pt states that he fell Sunday night and that he was seen that same day and states that his back was hurting he was evaluated ED and was sent home.. Pt states that [facility] and was told that his neck was injured. Pt states that when he woke up this morning his neck was a 10/10 pain more pain when he is up walking with tingling in bilateral arms and right leg.

## 2014-10-10 NOTE — ED Notes (Signed)
Brett Canales with security aware of patient's car-silver suv parked at [employer].  No family with patient

## 2014-10-10 NOTE — ED Provider Notes (Signed)
CSN: [id number]     Arrival date & time 10/10/14  1157 History  This chart was scribed for non-physician practitioner Jaynie Crumble, PA-C working with Melene Plan, DO by Littie Deeds, ED Scribe. This patient was seen in room TR11C/TR11C and the patient's care was started at 12:29 PM.       Chief Complaint  Patient presents with  . Neck Pain   The history is provided by the patient. No language interpreter was used.    HPI Comments: Charles Benson is a 54 y.o. 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 Patient tripped and fell while he was moving stuff. He began having back pain later that night and thought he had injured his back. He was seen here for the pain at that time. He did not have much neck pain at that time, mostly neck stiffness. Patient was seen at [facility] yesterday and was told he had injured his neck rather than his back. At this time, he primarily has pain in his right arm although he did not injure his arm. He has also had associated numbness and tingling worse in his right arm, weakness in his right hand, and tingling in his right leg. Patient denies loss of bowel or bladder control. He also denies head injury. He has not had XR imaging of his neck yet. He does have metal in his right wrist due to a wrist fracture about 10 years ago while he was in [place].   Past Medical History  Diagnosis Date  . Hypertension    Past Surgical History  Procedure Laterality Date  . Wrist surgery     History reviewed. No pertinent family history. History  Substance Use Topics  . Smoking status: Never Smoker   . Smokeless tobacco: Not on file  . Alcohol Use: Yes    Review of Systems  Genitourinary: Negative for enuresis.  Musculoskeletal: Positive for myalgias, back pain, neck pain and neck stiffness.  Neurological: Positive  for weakness and numbness.      Allergies  Review of patient's allergies indicates no known allergies.  Home Medications   Prior to Admission medications   Medication Sig Start Date End Date Taking? Authorizing Provider  aspirin 325 MG tablet Take 325 mg by mouth every 4 (four) hours as needed for pain.    Historical Provider, MD  cyclobenzaprine (FLEXERIL) 10 MG tablet Take 1 tablet (10 mg total) by mouth 2 (two) times daily as needed for muscle spasms. 10/06/14   Mirian Mo, MD  hydrOXYzine (ATARAX/VISTARIL) 25 MG tablet Take 1 tablet (25 mg total) by mouth every 6 (six) hours. 11/20/12   Gilda Crease, MD  Multiple Vitamins-Minerals (MULTIVITAMIN WITH MINERALS) tablet Take 1 tablet by mouth daily.    Historical Provider, MD   BP 120/97 mmHg  Pulse 104  Temp(Src) 98.5 F (36.9 C) (Oral)  Resp 16  Ht 6\' 1"  (1.854 m)  Wt 210 lb (95.255 kg)  BMI 27.71 kg/m2  SpO2 98% Physical Exam  Constitutional: He is oriented to person, place, and time. He appears well-developed and well-nourished. No distress.  HENT:  Head: Normocephalic and atraumatic.  Mouth/Throat: Oropharynx is clear and moist. No oropharyngeal exudate.  Eyes: Pupils are equal, round, and reactive to light.  Neck: Neck supple.  Midline cervical spine tenderness. Pain with forward flexion and  backward extension of the neck. No pain with rotation to the right or left.  Cardiovascular: Normal rate, regular rhythm and normal heart sounds.   Pulmonary/Chest: Effort normal and breath sounds normal. No respiratory distress. He has no wheezes. He has no rales.  Musculoskeletal: He exhibits no edema.  Neurological: He is alert and oriented to person, place, and time. No cranial nerve deficit.  Normal sensation in the right arm in all dermatomes. Patient states he feels slightly duller sensation in his right thumb and index finger. Grip strength is weakened on the right hand. Unable to elicit reflex right bicep tendon,  however, pt having trouble relaxing that hand. Bicep triceps strength is intact. Patient has chronic atrophy to the right trapezius muscle, full range of motion of the shoulder. 5/5 and equal LE strength bilaterall  Skin: Skin is warm and dry. No rash noted.  Psychiatric: He has a normal mood and affect. His behavior is normal.  Nursing note and vitals reviewed.   ED Course  Procedures  DIAGNOSTIC STUDIES: Oxygen Saturation is 98% on room air, normal by my interpretation.    COORDINATION OF CARE: 12:38 PM-Discussed treatment plan which includes pain medication, MR cervical spine, and XR imaging with patient/guardian at bedside and patient/guardian agreed to plan.    Labs Review Labs Reviewed - No data to display  Imaging Review Dg Cervical Spine Complete  10/10/2014   CLINICAL DATA:  Severe neck pain after falling 5 days ago. Initial encounter.  EXAM: CERVICAL SPINE  4+ VIEWS  COMPARISON:  [facility]out image from head CT 12/24/2009  FINDINGS: The prevertebral soft tissues are normal. There is new focal angulation at C5-6 with anterior disc space loss, interspinous and facet joint widening bilaterally. No definite acute associated fracture. The alignment is otherwise normal. There is stable disc space loss with uncinate spurring at C6-7 and C7-T1. The C1-2 articulation appears normal in the AP projection.  IMPRESSION: Findings are suspicious for an unstable injury at C5-6 with anterior disc space loss, interspinous widening and facet unroofing. Cervical spine immobilization and CT recommended for further evaluation.  Critical Value/emergent results were called by telephone at the time of interpretation on 10/10/2014 at 1:49 pm to Dr. Melene Plan, who verbally acknowledged these results.   Electronically Signed   By: Carey Bullocks M.D.   On: 10/10/2014 13:50   Dg Thoracic Spine 2 View  10/10/2014   CLINICAL DATA:  Larey Seat 10/05/2014 with severe neck and thoracic spine pain  EXAM: THORACIC SPINE 2 VIEWS   COMPARISON:  None.  FINDINGS: No fracture. No paraspinous hematoma. Multilevel degenerative disc disease. Normal alignment.  IMPRESSION: Degenerative changes with no acute findings   Electronically Signed   By: Esperanza Heir M.D.   On: 10/10/2014 13:49   Dg Lumbar Spine Complete  10/10/2014   CLINICAL DATA:  Fall, neck pain  EXAM: LUMBAR SPINE - COMPLETE 4+ VIEW  COMPARISON:  11/17/2009  FINDINGS: Five views of lumbar spine submitted. No acute fracture or subluxation. Mild disc space flattening with anterior spurring at L4-L5 level. Minimal disc space flattening at L5-S1 level. Mild anterior spurring lower endplate of T12 vertebral body. Alignment and vertebral body heights are preserved.  IMPRESSION: No acute fracture or subluxation. Mild degenerative changes as described above.   Electronically Signed   By: Natasha Mead M.D.   On: 10/10/2014 13:43   Mr Cervical Spine Wo Contrast  10/10/2014   CLINICAL DATA:  Larey Seat 5 days ago. Persistent neck pain and numbness and tingling  in right arm.  EXAM: MRI CERVICAL SPINE WITHOUT CONTRAST  TECHNIQUE: Multiplanar, multisequence MR imaging of the cervical spine was performed. No intravenous contrast was administered.  COMPARISON:  Cervical spine radiographs, same date.  FINDINGS: As demonstrated on the cervical spine radiographs there is a flexion injury at C5-6. The posterior disc space is widened and there is an acute disc protrusion. The interlaminar ligaments are torn and the interspinous ligament is torn. The facets are maintained. No perched or jumped facets. No tear drop fracture is identified.  No epidural hematoma. There is fifth prevertebral soft tissue swelling/ fluid. There is also extensive soft tissue injury involving the paraspinal neck musculature.  No obvious spinal cord injury. No cord contusion or hemorrhage is identified.  Broad-based disc protrusion at C5-6 along with and uncovered disc. There is mass effect on the ventral thecal sac and narrowing of  the ventral CSF space. Mild foraminal stenosis bilaterally, left greater than right.  C2-3:  No significant findings.  C3-4: Mild bulging annulus and osteophytic ridging with flattening of the ventral thecal sac and narrowing of the ventral CSF space. Mild foraminal encroachment bilaterally.  C4-5: Diffuse bulging degenerated annulus, osteophytic ridging and uncinate spurring. There is mild flattening of the ventral thecal sac and mild bilateral foraminal stenosis, right greater than left.  C6-7: Bulging annulus but no significant spinal or foraminal stenosis.  C7-T1: Mild annular bulge but no significant spinal or foraminal stenosis.  IMPRESSION: 1. Acute flexion injury involving the cervical spine at C5-6. No teardrop vertebral body fracture or obvious cord injury. The interlaminar and interspinous ligaments are torn at C5-6 and there is an acute disc protrusion. 2. The facets are maintained.  No perched or locked facets. 3. Extensive injury to the paraspinal muscles. 4. Prevertebral soft tissue swelling/fluid but no epidural hematoma.   Electronically Signed   By: Rudie Meyer M.D.   On: 10/10/2014 15:49     EKG Interpretation None      MDM   Final diagnoses:  Neck pain  Injury to ligament of cervical spine, initial encounter   Patient is here with continued neck pain radiating to the right arm. Patient has some weakness  in the right hand. His Severe pain with range of motion of the neck. Concerned about possible fracture versus nerve impingement from a herniated disc. I will get both x-ray and MRI of the cervical spine. X-ray of the lumbar and thoracic spine ordered as well.   2:16 PM Patient cervical x-rays concerning for unstable injury at C5-C6. Cervical spine collar ordered, I attempted to find patient, currently in MRI. We'll place in a collar soon as he is out.  2:44 PM I went to MR and placed patient in c-collar. I transported back to the emergency department.  4:34 PM Spoke to  Dr. Jule Ser about pt, he will come down and see him. Pt informed of results and the plan. He is in no distress at this time.    Filed Vitals:   10/10/14 1214  BP: 120/97  Pulse: 104  Temp: 98.5 F (36.9 C)  TempSrc: Oral  Resp: 16  Height:  (1.854 m)  Weight: 210 lb (95.255 kg)  SpO2: 98%    I personally performed the services described in this documentation, which was scribed in my presence. The recorded information has been reviewed and is accurate.    Jaynie Crumble, PA-C 10/11/14 1630  Melene Plan, DO 10/12/14 1824

## 2014-10-10 NOTE — Anesthesia Postprocedure Evaluation (Signed)
  Anesthesia Post-op Note  Patient: Charles Benson  Procedure(s) Performed: Procedure(s): ANTERIOR CERVICAL DECOMPRESSION/DISCECTOMY CERVICAL FIVE-SIX (Bilateral)  Patient Location: PACU  Anesthesia Type:General  Level of Consciousness: awake, alert  and oriented  Airway and Oxygen Therapy: Patient Spontanous Breathing  Post-op Pain: none  Post-op Assessment: Post-op Vital signs reviewed              Post-op Vital Signs: Reviewed  Last Vitals:  Filed Vitals:   10/10/14 2130  BP: 159/99  Pulse: 96  Temp: 36.4 C  Resp: 15    Complications: No apparent anesthesia complications

## 2014-10-10 NOTE — ED Notes (Signed)
Pt sts neck pain with radiation to right arm and leg; pt sts pain into back with movement; pt seen here for same earlier this week; pt denies bowel or bladder incontinence

## 2014-10-10 NOTE — Transfer of Care (Signed)
Immediate Anesthesia Transfer of Care Note  Patient: Charles Benson  Procedure(s) Performed: Procedure(s): ANTERIOR CERVICAL DECOMPRESSION/DISCECTOMY CERVICAL FIVE-SIX (Bilateral)  Patient Location: PACU  Anesthesia Type:General  Level of Consciousness: awake, alert  and oriented  Airway & Oxygen Therapy: Patient Spontanous Breathing and Patient connected to nasal cannula oxygen  Post-op Assessment: Report given to RN, Post -op Vital signs reviewed and stable and Patient moving all extremities X 4  Post vital signs: Reviewed and stable  Last Vitals:  Filed Vitals:   10/10/14 2100  BP: 168/97  Pulse: 88  Temp:   Resp: 14    Complications: No apparent anesthesia complications

## 2014-10-10 NOTE — Op Note (Signed)
10/10/2014  8:58 PM  PATIENT:  Charles Benson  54 y.o. male  PRE-OPERATIVE DIAGNOSIS:  C5-6 Cervical instability, cervical five-six herniation, right C6 cervical radiculopathy, cervical spondylosis, cervical degenerative disc disease  POST-OPERATIVE DIAGNOSIS:  C5-6 Cervical instability, cervical five-six herniation, right C6 cervical radiculopathy, cervical spondylosis, cervical degenerative disc disease  PROCEDURE:  Procedure(s):  C5-6 anterior cervical decompression and arthrodesis with structural allograft and tether cervical plating  SURGEON:  Surgeon(s): Shirlean Kelly, MD Coletta Memos, MD  ASSISTANTS: Coletta Memos, M.D.  ANESTHESIA:   general  EBL:  Total I/O In: 2100 [I.V.:2100] Out: 820 [Urine:800; Blood:20]  BLOOD ADMINISTERED:none  COUNT: Correct per nursing staff  DICTATION: Patient was brought to the operating room placed under general endotracheal anesthesia. Patient was placed in 10 pounds of halter traction. An x-ray was taken to check the alignment prior to prepping and draping. The alignment was much improved as compared to prior to being placed in traction. The neck was prepped with Betadine soap and solution and draped in a sterile fashion. A horizontal incision was made on the left side of the neck. The line of the incision was infiltrated with local anesthetic with epinephrine. Dissection was carried down thru the subcutaneous tissue and platysma, bipolar cautery was used to maintain hemostasis. Dissection was then carried down thru an avascular plane leaving the sternocleidomastoid carotid artery and jugular vein laterally and the trachea and esophagus medially. The ventral aspect of the vertebral column was identified, we identified the level with ecchymotic changes and softening of the tissues, and a localizing x-ray was taken. The C5-6 level was identified. The remaining annulus was incised and the disc space entered. Discectomy was performed with micro-curettes  and pituitary rongeurs. The operating microscope was draped and brought into the field provided additional magnification illumination and visualization. Discectomy was continued posteriorly thru the disc space and then the cartilaginous endplate was removed using micro-curettes along with the high-speed drill. Posterior osteophytic overgrowth was removed using the high-speed drill along with a 2 mm thin footplated Kerrison punch. 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 We then continued to remove osteophytic overgrowth and disc material decompressing the neural foramina and exiting nerve roots bilaterally. Once the decompression was completed hemostasis was established with the use of Gelfoam with thrombin and Surgifoam. The Gelfoam was removed the wound irrigated and hemostasis confirmed. A thin layer of Surgifoam was left in the posterior disc space. We then measured the height of the intravertebral disc space and selected a 8 millimeter in height structural allograft. It was hydrated and saline solution and then gently positioned in the intravertebral disc space and countersunk. We then selected a 16 millimeter in height Tether cervical plate. It was positioned over the fusion construct and secured to the vertebra with 4 x 15 mm fixed screws at the C5 level, and 4 x 16 mm variable screws at the C6 level. Each screw hole was started with the high-speed drill and then the screws placed once all the screws were placed final tightening was performed. The wound was irrigated with bacitracin solution checked for hemostasis which was established and confirmed. An x-ray was taken which showed the graft in good position, the plate and screws in good position, the overall alignment to be good, but some distraction between the C5 and C6 facets. We then  proceeded with closure. The platysma was closed  with interrupted inverted 2-0 undyed Vicryl suture, the subcutaneous and subcuticular closed with interrupted inverted 3-0 undyed Vicryl suture. The skin edges were approximated with Dermabond.   Dr. Franky Macho and I had reviewed the case and imaging prior to surgery, and considered whether to proceed with posterior stabilization as well. In the end we felt that good decompression and stabilization of been achieved, and that it would be best to follow the patient postoperatively, rather than proceeding with a posterior stabilization at this time. The patient was taken out of cervical traction. To be reversed from the anesthetic, extubated, and taken to the recovery room for further care.   PLAN OF CARE: Admit to inpatient   PATIENT DISPOSITION:  PACU - hemodynamically stable.   Delay start of Pharmacological VTE agent (>24hrs) due to surgical blood loss or risk of bleeding:  yes

## 2014-10-10 NOTE — ED Notes (Signed)
Patient's belongings reviewed and wallet sent to security by lori berdik, rn

## 2014-10-10 NOTE — ED Notes (Signed)
Permit completed as requested by dr Newell Coral

## 2014-10-10 NOTE — Anesthesia Preprocedure Evaluation (Addendum)
Anesthesia Evaluation  Patient identified by MRN, date of birth, ID band Patient awake    Reviewed: Allergy & Precautions, NPO status , Patient's Chart, lab work & pertinent test results  History of Anesthesia Complications Negative for: history of anesthetic complications  Airway Mallampati: I  TM Distance: >3 FB Neck ROM: Limited   Comment: C-collar Dental  (+) Teeth Intact, Dental Advisory Given   Pulmonary    Pulmonary exam normal       Cardiovascular hypertension, Pt. on medications Rhythm:Regular Rate:Normal     Neuro/Psych R leg and R arm tingling and numbness    GI/Hepatic   Endo/Other    Renal/GU      Musculoskeletal   Abdominal Normal abdominal exam  (+)   Peds  Hematology   Anesthesia Other Findings   Reproductive/Obstetrics                            Anesthesia Physical Anesthesia Plan  ASA: III and emergent  Anesthesia Plan: General   Post-op Pain Management:    Induction: Intravenous  Airway Management Planned: Oral ETT and Video Laryngoscope Planned  Additional Equipment:   Intra-op Plan:   Post-operative Plan: Extubation in OR  Informed Consent: I have reviewed the patients History and Physical, chart, labs and discussed the procedure including the risks, benefits and alternatives for the proposed anesthesia with the patient or authorized representative who has indicated his/her understanding and acceptance.   Dental advisory given  Plan Discussed with: CRNA, Anesthesiologist and Surgeon  Anesthesia Plan Comments:        Anesthesia Quick Evaluation

## 2014-10-10 NOTE — ED Notes (Signed)
Report to anesthesia.  Permit with patient to neuro

## 2014-10-10 NOTE — Progress Notes (Signed)
Filed Vitals:   10/10/14 2050 10/10/14 2100 10/10/14 2115 10/10/14 2130  BP: 160/92 168/97 159/93 159/99  Pulse:  88 85 96  Temp: 97 F (36.1 C)   97.5 F (36.4 C)  TempSrc:      Resp: Height:      Weight:      SpO2: 94% 97% 98% 98%    CBC  Recent Labs  10/10/14 1700  WBC 7.1  HGB 15.1  HCT 43.6  PLT 178   BMET  Recent Labs  10/10/14 1700  NA 136  K 3.5  CL 101  CO2 25  GLUCOSE 97  BUN 12  CREATININE 0.74  CALCIUM 8.7*    Patient resting comfortably in PACU. Moving all 4 extremities to command. Wound clean and dry. We will have nursing staff DC Foley prior to leaving PACU. To begin to ambulate first thing in morning, or sooner if he feels up to it.  Plan: Progress through postoperative recovery.  Hewitt Shorts, MD 10/10/2014, 9:38 PM

## 2014-10-11 MED ORDER — GABAPENTIN 300 MG PO CAPS
300.0000 mg | ORAL_CAPSULE | Freq: Three times a day (TID) | ORAL | Status: DC
Start: 2014-10-11 — End: 2014-10-12
  Administered 2014-10-11 – 2014-10-12 (×4): 300 mg via ORAL
  Filled 2014-10-11 (×4): qty 1

## 2014-10-11 NOTE — Progress Notes (Signed)
Filed Vitals:   10/10/14 2130 10/10/14 2220 10/11/14 0137 10/11/14 0535  BP: 159/99 154/89 140/89 116/81  Pulse: 96 92 90 100  Temp: 97.5 F (36.4 C) 97.6 F (36.4 C) 97.8 F (36.6 C) 98.3 F (36.8 C)  TempSrc:  Oral Oral Oral  Resp: Height:   (1.854 m)    Weight:  92.942 kg (204 lb 14.4 oz)    SpO2: 98% 99% 98% 98%    CBC  Recent Labs  10/10/14 1700  WBC 7.1  HGB 15.1  HCT 43.6  PLT 178   BMET  Recent Labs  10/10/14 1700  NA 136  K 3.5  CL 101  CO2 25  GLUCOSE 97  BUN 12  CREATININE 0.74  CALCIUM 8.7*    Patient sitting up in chair, eating breakfast. Complaining of weakness and pain in the left upper extremity. Feels right upper extremity is much improved regarding pain, weakness, and numbness. Wound clean and dry. In Aspen cervical collar.  Strength in left upper extremity is 5/5 in the deltoid, biceps, triceps, intrinsics, and grip, however he has a hesitancy to exert full effort, seemingly due to pain.  Plan: Patient continues on Toradol at this time, we'll add gabapentin, and continue to monitor. Discussed with patient and nursing staff progressing to ambulation the halls at least 4-5 times today, eventually on his own, without staff having to assist or accompany him.   Hewitt Shorts, MD 10/11/2014, 8:57 AM

## 2014-10-11 NOTE — Progress Notes (Signed)
Patient arrived to 4N10 at 2145 alert & oriented no complaints.

## 2014-10-11 NOTE — Progress Notes (Signed)
Utilization review completed.  

## 2014-10-11 NOTE — Progress Notes (Signed)
Patient requested that belongings be retrieved from safe, writer collected the envelop from security and handed to patient.

## 2014-10-12 MED ORDER — HYDROCODONE-ACETAMINOPHEN 5-325 MG PO TABS: 1.0000 | ORAL_TABLET | ORAL | Status: DC | PRN

## 2014-10-12 NOTE — Discharge Summary (Signed)
Physician Discharge Summary  Patient ID: Charles Benson MRN: [id number] DOB/AGE: 56-Aug-1962 77 y.o.  Admit date: 10/10/2014 Discharge date: 10/12/2014  Admission Diagnoses:  C5-6 Cervical instability, cervical five-six herniation, right C6 cervical radiculopathy, cervical spondylosis, cervical degenerative disc disease  Discharge Diagnoses:  C5-6 Cervical instability, cervical five-six herniation, right C6 cervical radiculopathy, cervical spondylosis, cervical degenerative disc disease Active Problems:   Cervical spine instability   Discharged Condition: good  Hospital Course: Patient admitted from the emergency room after having injured himself 5 days earlier. Found to have acute angulation at the C5-6 level, with disruption of the posterior ligaments, and an acute C5-6 cervical disc herniation with thecal sac, spinal cord, and nerve root compression. Patient was taken to surgery and underwent a C5-6 anterior cervical decompression and arthrodesis with structural allograft and tether cervical plating. Postoperatively he has had excellent relief of his right cervical radiculopathy, but has had some mild weakness in the left shoulder abduction. He's been exercising it regularly, and has been given instructions in exercises for it. He is up and ambulating actively. His wound is healing well. He is asking to be discharged to home. He has been given instructions regarding wound care and activities. He is to return for follow-up with me in 3 weeks at the office. He is immobilized in a Aspen cervical collar, which I anticipate using for at least 2 months following surgery.  Discharge Exam: Blood pressure 134/79, pulse 73, temperature 97.7 F (36.5 C), temperature source Oral, resp. rate 18, height  (1.854 m), weight 92.942 kg (204 lb 14.4 oz), SpO2 97 %.  Disposition: 01-Home or Self Care     Medication List    STOP taking these medications        hydrOXYzine 25 MG tablet  Commonly known  as:  ATARAX/VISTARIL      TAKE these medications        aspirin 325 MG tablet  Take 325 mg by mouth every 4 (four) hours as needed for pain.     cyclobenzaprine 10 MG tablet  Commonly known as:  FLEXERIL  Take 1 tablet (10 mg total) by mouth 2 (two) times daily as needed for muscle spasms.     HYDROcodone-acetaminophen 5-325 MG per tablet  Commonly known as:  NORCO/VICODIN  Take 1 tablet by mouth every 6 (six) hours as needed.     HYDROcodone-acetaminophen 5-325 MG per tablet  Commonly known as:  NORCO/VICODIN  Take 1-2 tablets by mouth every 4 (four) hours as needed (mild pain).     multivitamin with minerals tablet  Take 1 tablet by mouth daily.         SignedHewitt Shorts 10/12/2014, 9:22 AM

## 2014-10-12 NOTE — Progress Notes (Signed)
Patient is being d/c home. D/c instructions given and patient verbalized understanding. 

## 2014-10-13 ENCOUNTER — Encounter (HOSPITAL_COMMUNITY): Payer: Self-pay | Admitting: Neurosurgery

## 2016-06-01 ENCOUNTER — Emergency Department (HOSPITAL_COMMUNITY)
Admission: EM | Admit: 2016-06-01 | Discharge: 2016-06-01 | Disposition: A | Discharge status: Home / Self Care | Payer: PRIVATE HEALTH INSURANCE | Attending: Emergency Medicine | Admitting: Emergency Medicine

## 2016-06-01 ENCOUNTER — Emergency Department (HOSPITAL_COMMUNITY): Payer: PRIVATE HEALTH INSURANCE

## 2016-06-01 ENCOUNTER — Encounter (HOSPITAL_COMMUNITY): Payer: Self-pay | Admitting: *Deleted

## 2016-06-01 DIAGNOSIS — I10 Essential (primary) hypertension: Secondary | ICD-10-CM | POA: Diagnosis not present

## 2016-06-01 DIAGNOSIS — T148XXA Other injury of unspecified body region, initial encounter: Secondary | ICD-10-CM

## 2016-06-01 DIAGNOSIS — Z23 Encounter for immunization: Secondary | ICD-10-CM | POA: Diagnosis not present

## 2016-06-01 DIAGNOSIS — Y9389 Activity, other specified: Secondary | ICD-10-CM | POA: Diagnosis not present

## 2016-06-01 DIAGNOSIS — Z7982 Long term (current) use of aspirin: Secondary | ICD-10-CM | POA: Diagnosis not present

## 2016-06-01 DIAGNOSIS — S300XXA Contusion of lower back and pelvis, initial encounter: Secondary | ICD-10-CM | POA: Diagnosis not present

## 2016-06-01 DIAGNOSIS — Y999 Unspecified external cause status: Secondary | ICD-10-CM | POA: Insufficient documentation

## 2016-06-01 DIAGNOSIS — S81012A Laceration without foreign body, left knee, initial encounter: Secondary | ICD-10-CM | POA: Insufficient documentation

## 2016-06-01 DIAGNOSIS — Y92410 Unspecified street and highway as the place of occurrence of the external cause: Secondary | ICD-10-CM | POA: Insufficient documentation

## 2016-06-01 DIAGNOSIS — Z79899 Other long term (current) drug therapy: Secondary | ICD-10-CM | POA: Diagnosis not present

## 2016-06-01 DIAGNOSIS — S83512A Sprain of anterior cruciate ligament of left knee, initial encounter: Secondary | ICD-10-CM | POA: Insufficient documentation

## 2016-06-01 DIAGNOSIS — S8992XA Unspecified injury of left lower leg, initial encounter: Secondary | ICD-10-CM | POA: Diagnosis present

## 2016-06-01 DIAGNOSIS — M5442 Lumbago with sciatica, left side: Secondary | ICD-10-CM

## 2016-06-01 MED ORDER — TETANUS-DIPHTH-ACELL PERTUSSIS 5-2.5-18.5 LF-MCG/0.5 IM SUSP
0.5000 mL | Freq: Once | INTRAMUSCULAR | Status: AC
  Administered 2016-06-01: 0.5 mL via INTRAMUSCULAR
  Filled 2016-06-01: qty 0.5

## 2016-06-01 MED ORDER — ACETAMINOPHEN 500 MG PO TABS
1000.0000 mg | ORAL_TABLET | Freq: Once | ORAL | Status: AC
  Administered 2016-06-01: 1000 mg via ORAL
  Filled 2016-06-01: qty 2

## 2016-06-01 MED ORDER — IBUPROFEN 800 MG PO TABS
800.0000 mg | ORAL_TABLET | Freq: Once | ORAL | Status: AC
  Administered 2016-06-01: 800 mg via ORAL
  Filled 2016-06-01: qty 1
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
## 2016-06-01 NOTE — Progress Notes (Signed)
Orthopedic Tech Progress Note Patient Details:  Jetta LoutWilliam Lipsett 1960/12/11 [id number]  Ortho Devices Type of Ortho Device: Crutches, Knee Immobilizer Ortho Device/Splint Location: lle Ortho Device/Splint Interventions: Application   Nyasha Rahilly 06/01/2016, 2:17 PM

## 2016-06-01 NOTE — ED Notes (Signed)
On way to XR 

## 2016-06-01 NOTE — ED Notes (Signed)
Ortho tech paged for administration of knee immobilizer and crutches.
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
## 2016-06-01 NOTE — Discharge Instructions (Signed)
Take 4 over the counter ibuprofen tablets 3 times a day or 2 over-the-counter naproxen tablets twice a day for pain. Also take tylenol 1000mg (2 extra strength) four times a day.   Follow up with ortho to have your knee evaluated.

## 2016-06-07 ENCOUNTER — Emergency Department (HOSPITAL_COMMUNITY)
Admission: EM | Admit: 2016-06-07 | Discharge: 2016-06-07 | Disposition: A | Discharge status: Home / Self Care | Payer: Worker's Compensation | Attending: Emergency Medicine | Admitting: Emergency Medicine

## 2016-06-07 ENCOUNTER — Encounter (HOSPITAL_COMMUNITY): Payer: Self-pay

## 2016-06-07 DIAGNOSIS — I10 Essential (primary) hypertension: Secondary | ICD-10-CM | POA: Insufficient documentation

## 2016-06-07 DIAGNOSIS — S7002XD Contusion of left hip, subsequent encounter: Secondary | ICD-10-CM | POA: Insufficient documentation

## 2016-06-07 DIAGNOSIS — S300XXD Contusion of lower back and pelvis, subsequent encounter: Secondary | ICD-10-CM | POA: Insufficient documentation

## 2016-06-07 DIAGNOSIS — S8992XD Unspecified injury of left lower leg, subsequent encounter: Secondary | ICD-10-CM | POA: Diagnosis present

## 2016-06-07 DIAGNOSIS — Z79899 Other long term (current) drug therapy: Secondary | ICD-10-CM | POA: Insufficient documentation

## 2016-06-07 DIAGNOSIS — S8002XD Contusion of left knee, subsequent encounter: Secondary | ICD-10-CM | POA: Insufficient documentation

## 2016-06-07 DIAGNOSIS — Z7982 Long term (current) use of aspirin: Secondary | ICD-10-CM | POA: Insufficient documentation

## 2016-06-07 MED ORDER — OXYCODONE-ACETAMINOPHEN 5-325 MG PO TABS: 1.0000 | ORAL_TABLET | ORAL | 0 refills | Status: DC | PRN

## 2016-06-07 NOTE — Discharge Instructions (Signed)
Read the information below.  Use the prescribed medication as directed.  Please discuss all new medications with your pharmacist.  You may return to the Emergency Department at any time for worsening condition or any new symptoms that concern you.    It is very important that you wear your knee immobilizer at all times.  Use your crutches to get around.  Elevate your leg and use ICE at least 4 times daily.  Do not drink alcohol while using the prescribed pain medication.  Do not drive or operate heavy machinery while taking the percocet or after drinking alcohol.  See your orthopedist in two days as planned.    If you develop uncontrolled pain, weakness or numbness of the extremity, or you are unable to move your foot, return to the ER for a recheck.

## 2016-06-07 NOTE — ED Notes (Signed)
Pt is calling a cab for ride home.
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
## 2016-06-09 ENCOUNTER — Ambulatory Visit (INDEPENDENT_AMBULATORY_CARE_PROVIDER_SITE_OTHER): Payer: Worker's Compensation | Admitting: Orthopedic Surgery

## 2016-06-09 ENCOUNTER — Encounter (INDEPENDENT_AMBULATORY_CARE_PROVIDER_SITE_OTHER): Payer: Self-pay | Admitting: Orthopedic Surgery

## 2016-06-09 DIAGNOSIS — M545 Low back pain, unspecified: Secondary | ICD-10-CM

## 2016-06-09 DIAGNOSIS — M25552 Pain in left hip: Secondary | ICD-10-CM

## 2016-06-09 DIAGNOSIS — M25562 Pain in left knee: Secondary | ICD-10-CM
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
## 2016-07-11 ENCOUNTER — Encounter (INDEPENDENT_AMBULATORY_CARE_PROVIDER_SITE_OTHER): Payer: Self-pay | Admitting: Orthopedic Surgery

## 2016-07-11 ENCOUNTER — Ambulatory Visit (INDEPENDENT_AMBULATORY_CARE_PROVIDER_SITE_OTHER): Payer: Worker's Compensation | Admitting: Orthopedic Surgery

## 2016-07-11 DIAGNOSIS — M25562 Pain in left knee: Secondary | ICD-10-CM | POA: Diagnosis not present

## 2016-07-11 DIAGNOSIS — M25552 Pain in left hip: Secondary | ICD-10-CM | POA: Diagnosis not present

## 2016-07-11 DIAGNOSIS — M545 Low back pain: Secondary | ICD-10-CM | POA: Diagnosis not present
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
## 2016-07-12 ENCOUNTER — Telehealth (INDEPENDENT_AMBULATORY_CARE_PROVIDER_SITE_OTHER): Payer: Self-pay

## 2016-07-12 DIAGNOSIS — M25552 Pain in left hip: Secondary | ICD-10-CM

## 2016-07-12 NOTE — Telephone Encounter (Signed)
Patient called stating that he answered a question wrong at his visit on 07/11/16.  He told Dr. August Saucerean that he wasn't having groin pain, but he called this morning and stated that he is having groin pain.  Would like a call back concerning this issue.  If no answer please leave a VM.  Thank You.  CB# is [phone].

## 2016-07-12 NOTE — Addendum Note (Signed)
Addended byPrescott Parma: Miller Edgington on: 07/12/2016 11:04 AM   Modules accepted: Orders

## 2016-07-12 NOTE — Telephone Encounter (Signed)
s/w patient and he stated that he is indeed having deep left groin pain. Said that he misunderstood when the question was asked at his appointment yesterday about groin pain. He assumed that we were referring to pain in the muscle. Per your note advised patient that we could refer him for hip injection if w/c would approve. He was ok with this and agreed. I put order in for left hip injection with Dr Alvester MorinNewton.

## 2016-07-12 NOTE — Telephone Encounter (Signed)
thx

## 2016-07-13 NOTE — Telephone Encounter (Signed)
Injection has been approved by wc adj Patty Sermonsina Tucker and I called pt and scheduled him for 07/22/16 @ 10:00

## 2016-07-22 ENCOUNTER — Encounter (INDEPENDENT_AMBULATORY_CARE_PROVIDER_SITE_OTHER): Payer: Self-pay | Admitting: Physical Medicine and Rehabilitation

## 2016-07-22 ENCOUNTER — Ambulatory Visit (INDEPENDENT_AMBULATORY_CARE_PROVIDER_SITE_OTHER): Payer: Worker's Compensation

## 2016-07-22 ENCOUNTER — Ambulatory Visit (INDEPENDENT_AMBULATORY_CARE_PROVIDER_SITE_OTHER): Payer: Worker's Compensation | Admitting: Physical Medicine and Rehabilitation

## 2016-07-22 DIAGNOSIS — M25552 Pain in left hip: Secondary | ICD-10-CM | POA: Diagnosis not present

## 2016-07-22 NOTE — Patient Instructions (Signed)
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
## 2016-07-22 NOTE — Progress Notes (Deleted)
Left hip/ groin pain. Pain gets worse towards the end of the day after being on feet a lot. Feels better in the mornings.

## 2016-07-25 ENCOUNTER — Encounter (INDEPENDENT_AMBULATORY_CARE_PROVIDER_SITE_OTHER): Payer: Self-pay | Admitting: Physical Medicine and Rehabilitation

## 2016-07-25 MED ORDER — TRIAMCINOLONE ACETONIDE 40 MG/ML IJ SUSP
80.0000 mg | INTRAMUSCULAR | Status: AC | PRN
  Administered 2016-07-22: 80 mg via INTRA_ARTICULAR

## 2016-07-25 MED ORDER — BUPIVACAINE HCL 0.5 % IJ SOLN
3.0000 mL | INTRAMUSCULAR | Status: AC | PRN
  Administered 2016-07-22: 3 mL via INTRA_ARTICULAR

## 2016-08-25 ENCOUNTER — Ambulatory Visit (INDEPENDENT_AMBULATORY_CARE_PROVIDER_SITE_OTHER): Payer: Self-pay | Admitting: Orthopedic Surgery

## 2016-08-31 ENCOUNTER — Encounter (INDEPENDENT_AMBULATORY_CARE_PROVIDER_SITE_OTHER): Payer: Self-pay | Admitting: Orthopedic Surgery

## 2016-08-31 ENCOUNTER — Ambulatory Visit (INDEPENDENT_AMBULATORY_CARE_PROVIDER_SITE_OTHER): Payer: Worker's Compensation | Admitting: Orthopedic Surgery

## 2016-08-31 DIAGNOSIS — M25552 Pain in left hip: Secondary | ICD-10-CM
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
## 2016-10-31 ENCOUNTER — Ambulatory Visit (INDEPENDENT_AMBULATORY_CARE_PROVIDER_SITE_OTHER): Payer: Self-pay | Admitting: Orthopedic Surgery

## 2016-11-02 ENCOUNTER — Encounter (INDEPENDENT_AMBULATORY_CARE_PROVIDER_SITE_OTHER): Payer: Self-pay | Admitting: Orthopedic Surgery

## 2016-11-02 ENCOUNTER — Ambulatory Visit (INDEPENDENT_AMBULATORY_CARE_PROVIDER_SITE_OTHER): Payer: Worker's Compensation | Admitting: Orthopedic Surgery

## 2016-11-02 DIAGNOSIS — M25552 Pain in left hip: Secondary | ICD-10-CM | POA: Diagnosis not present
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
## 2016-11-29 ENCOUNTER — Telehealth (INDEPENDENT_AMBULATORY_CARE_PROVIDER_SITE_OTHER): Payer: Self-pay

## 2016-11-29 NOTE — Telephone Encounter (Signed)
error 

## 2016-11-30 ENCOUNTER — Encounter: Payer: Self-pay | Admitting: Orthopedic Surgery

## 2016-12-14 ENCOUNTER — Encounter (INDEPENDENT_AMBULATORY_CARE_PROVIDER_SITE_OTHER): Payer: Self-pay | Admitting: Orthopedic Surgery

## 2016-12-14 ENCOUNTER — Ambulatory Visit (INDEPENDENT_AMBULATORY_CARE_PROVIDER_SITE_OTHER): Payer: Worker's Compensation | Admitting: Orthopedic Surgery

## 2016-12-14 DIAGNOSIS — M12552 Traumatic arthropathy, left hip: Secondary | ICD-10-CM

## 2016-12-14 MED ORDER — NABUMETONE 500 MG PO TABS: ORAL_TABLET | ORAL | 0 refills | Status: DC

## 2016-12-15 NOTE — Progress Notes (Signed)
Office Visit Note   Patient: Charles Benson           Date of Birth: 1960/06/01           MRN: [id number] Visit Date: 12/14/2016 Requested by: No referring provider defined for this encounter. PCP: Patient, No Pcp Per  Subjective: Chief Complaint  Patient presents with  . Left Hip - Follow-up    HPI: Donnovan is a 56 year old patient with left hip pain.  Since I have seen him he is had an MRI scan of his left hip.  Patient states that his left hip was fine before the injury that caused his current symptoms.  Injection into the hip joint helped but his pain has recurred.  Does report pain in the groin at the end of the day.  He has tried ibuprofen without much relief.  MRI scan is reviewed and it shows pretty significant femoral and especially acetabular arthritis.  Remainder of hip MRI scan is normal.  No muscle tears or fractures.              ROS: All systems reviewed are negative as they relate to the chief complaint within the history of present illness.  Patient denies  fevers or chills.   Assessment & Plan: Visit Diagnoses:  1. Traumatic arthritis of left hip     Plan: Impression is left hip pain traumatic left hip arthritis noted on MRI scan.  He does have Trendelenburg gait and pain with internal rotation of the left hip.  Plan is to try him on Relafen 500 mg by mouth twice a day for 2 months.  8 week return for discussion about further treatment plans.  Discussed using a cane in the right hand.  I think he will need hip replacement sometime in the future but he is on the slightly young side for that.  I'll see him back in 8 weeks.  Follow-Up Instructions: Return in about 8 weeks (around 02/08/2017).   Orders:  No orders of the defined types were placed in this encounter.  Meds ordered this encounter  Medications  . nabumetone (RELAFEN) 500 MG tablet    Sig: 1 po bid x 60 days    Dispense:  120 tablet    Refill:  0      Procedures: No procedures  performed   Clinical Data: No additional findings.  Objective: Vital Signs: There were no vitals taken for this visit.  Physical Exam:   Constitutional: Patient appears well-developed HEENT:  Head: Normocephalic Eyes:EOM are normal Neck: Normal range of motion Cardiovascular: Normal rate Pulmonary/chest: Effort normal Neurologic: Patient is alert Skin: Skin is warm Psychiatric: Patient has normal mood and affect    Ortho Exam: Orthopedic exam demonstrates equal leg lengths palpable pedal pulses no real loss of range of motion left versus right hip but he does have pain with internal rotation on the left-hand side.  Hip flexion strength is symmetric and intact.  No other masses lymph adenopathy or skin changes noted in the left hip region  Specialty Comments:  No specialty comments available.  Imaging: No results found.   PMFS History: Patient Active Problem List   Diagnosis Date Noted  . 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 Hypertension     No family history on file.  Past Surgical History:  Procedure Laterality Date  . ANTERIOR CERVICAL DECOMP/DISCECTOMY FUSION Bilateral 10/10/2014   Procedure: ANTERIOR CERVICAL DECOMPRESSION/DISCECTOMY CERVICAL FIVE-SIX;  Surgeon: Shirlean Kelly,  MD;  Location: [facility];  Service: Neurosurgery;  Laterality: Bilateral;  . WRIST SURGERY     Social History   Occupational History  . Not on file.   Social History Main Topics  . Smoking status: Never Smoker  . Smokeless tobacco: Never Used  . Alcohol use Yes  . Drug use: No  . Sexual activity: Not on file

## 2017-02-08 ENCOUNTER — Ambulatory Visit (INDEPENDENT_AMBULATORY_CARE_PROVIDER_SITE_OTHER): Payer: Self-pay | Admitting: Orthopedic Surgery

## 2017-02-16 ENCOUNTER — Ambulatory Visit (INDEPENDENT_AMBULATORY_CARE_PROVIDER_SITE_OTHER): Payer: Worker's Compensation | Admitting: Orthopedic Surgery

## 2017-02-16 ENCOUNTER — Encounter (INDEPENDENT_AMBULATORY_CARE_PROVIDER_SITE_OTHER): Payer: Self-pay | Admitting: Orthopedic Surgery

## 2017-02-16 DIAGNOSIS — M12552 Traumatic arthropathy, left hip: Secondary | ICD-10-CM | POA: Diagnosis not present
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
## 2017-02-22 ENCOUNTER — Other Ambulatory Visit (INDEPENDENT_AMBULATORY_CARE_PROVIDER_SITE_OTHER): Payer: Self-pay | Admitting: Orthopedic Surgery

## 2017-02-22 NOTE — Telephone Encounter (Signed)
y

## 2017-02-22 NOTE — Telephone Encounter (Signed)
Ok to rf? 

## 2017-04-28 ENCOUNTER — Other Ambulatory Visit (INDEPENDENT_AMBULATORY_CARE_PROVIDER_SITE_OTHER): Payer: Self-pay | Admitting: Orthopedic Surgery

## 2017-04-28 NOTE — Telephone Encounter (Signed)
Ok to rf? 

## 2017-04-28 NOTE — Telephone Encounter (Signed)
y

## 2017-08-29 ENCOUNTER — Telehealth (INDEPENDENT_AMBULATORY_CARE_PROVIDER_SITE_OTHER): Payer: Self-pay

## 2017-08-29 ENCOUNTER — Encounter (INDEPENDENT_AMBULATORY_CARE_PROVIDER_SITE_OTHER): Payer: Self-pay

## 2017-08-29 NOTE — Telephone Encounter (Signed)
Pt states he is needing dental clearance for surgery, Asked that I send his wc adj Gayla Medicusatti Wise a note stating that this is needed. All he had was her email. Emailed to [email or web address]

## 2018-03-12 ENCOUNTER — Encounter (INDEPENDENT_AMBULATORY_CARE_PROVIDER_SITE_OTHER): Payer: Self-pay | Admitting: Orthopedic Surgery

## 2018-03-12 ENCOUNTER — Ambulatory Visit (INDEPENDENT_AMBULATORY_CARE_PROVIDER_SITE_OTHER): Payer: Worker's Compensation

## 2018-03-12 ENCOUNTER — Ambulatory Visit (INDEPENDENT_AMBULATORY_CARE_PROVIDER_SITE_OTHER): Payer: Worker's Compensation | Admitting: Orthopedic Surgery

## 2018-03-12 DIAGNOSIS — M25552 Pain in left hip: Secondary | ICD-10-CM | POA: Diagnosis not present

## 2018-03-12 DIAGNOSIS — M1612 Unilateral primary osteoarthritis, left hip: Secondary | ICD-10-CM

## 2018-03-17 ENCOUNTER — Encounter (INDEPENDENT_AMBULATORY_CARE_PROVIDER_SITE_OTHER): Payer: Self-pay | Admitting: Orthopedic Surgery

## 2018-03-17 NOTE — Progress Notes (Signed)
Office Visit Note   Patient: Charles Benson           Date of Birth: 1961-02-18           MRN: [id number] Visit Date: 03/12/2018 Requested by: No referring provider defined for this encounter. PCP: Patient, No Pcp Per  Subjective: Chief Complaint  Patient presents with  . Left Hip - Pain    HPI: Charles Benson is a patient with left hip pain.  Last seen 02/16/2017.  Has had radiographs and MRI done which do show significant degenerative changes posttraumatic in the left hip.  Reports continued pain in the groin.  Accident while he was at work occurred May 31, 2016.  Symptoms are worse when he goes from sitting to standing.  Currently he is working as a Social research officer, governmentstore manager.  He lives alone.  He did have a molar extracted September 19 and he has healed up from that event.  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  Injection into the hip joint itself helped for about 6 weeks.  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              ROS: All systems reviewed are negative as they relate to the chief complaint within the history of present illness.  Patient denies  fevers or chills.   Assessment & Plan: Visit Diagnoses:  1. Pain in left hip   2. Arthritis of left hip     Plan: Impression is progressive left hip arthritis confirmed on radiographs today and on clinical examination.  Plan is left total hip replacement.  Risk and benefits are discussed including but limited to infection nerve vessel damage leg length inequality and dislocation as well as potential limited longevity of the implant.  Patient understands the risk and benefits and wishes to proceed.  All questions answered  Follow-Up Instructions: No follow-ups on file.   Orders:  Orders Placed This Encounter  Procedures  . XR HIP UNILAT W OR W/O PELVIS 2-3 VIEWS LEFT   No orders of the defined types were placed in this encounter.     Procedures: No procedures performed   Clinical Data: No additional findings.  Objective: Vital  Signs: There were no vitals taken for this visit.  Physical Exam:   Constitutional: Patient appears well-developed HEENT:  Head: Normocephalic Eyes:EOM are normal Neck: Normal range of motion Cardiovascular: Normal rate Pulmonary/chest: Effort normal Neurologic: Patient is alert Skin: Skin is warm Psychiatric: Patient has normal mood and affect    Ortho Exam: Ortho exam demonstrates equal leg lengths.  Slightly antalgic gait to the left.  Does have groin pain on the left with internal extra rotation of that left hip.  Knee range of motion full.  Ankle dorsiflexion plantarflexion intact.  Specialty Comments:  No specialty comments available.  Imaging: No results found.   PMFS History: Patient Active Problem List   Diagnosis Date Noted  . 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 Hypertension     History reviewed. No pertinent family history.  Past Surgical History:  Procedure Laterality Date  . ANTERIOR CERVICAL DECOMP/DISCECTOMY FUSION Bilateral 10/10/2014   Procedure: ANTERIOR CERVICAL DECOMPRESSION/DISCECTOMY CERVICAL FIVE-SIX;  Surgeon: Shirlean Kellyobert Nudelman, MD;  Location: [facility];  Service: Neurosurgery;  Laterality: Bilateral;  . WRIST SURGERY     Social History   Occupational History  . Not on file  Tobacco Use  . Smoking status: Never Smoker  . Smokeless tobacco: Never Used  Substance  and Sexual Activity  . Alcohol use: Yes  . Drug use: No  . Sexual activity: Not on file

## 2018-04-02 ENCOUNTER — Other Ambulatory Visit (INDEPENDENT_AMBULATORY_CARE_PROVIDER_SITE_OTHER): Payer: Self-pay | Admitting: Orthopedic Surgery

## 2018-04-02 DIAGNOSIS — M12552 Traumatic arthropathy, left hip: Secondary | ICD-10-CM

## 2018-04-11 ENCOUNTER — Telehealth (INDEPENDENT_AMBULATORY_CARE_PROVIDER_SITE_OTHER): Payer: Self-pay | Admitting: Orthopedic Surgery

## 2018-04-11 NOTE — Telephone Encounter (Signed)
FYI

## 2018-04-11 NOTE — Telephone Encounter (Signed)
Pls put something else in there thx

## 2018-04-11 NOTE — Telephone Encounter (Signed)
Fyi:  Patient's Left total hip surgery has been cancelled for 04-19-18 @ [facility].  WC is requiring second opinion. The IME has been made at [facility] for Feb 19th.  He had a much earlier date but was told that the doctor he was scheduled with (Dr. Turner Daniels) had cardiac surgery and would be out for 12 weeks.

## 2018-04-19 ENCOUNTER — Inpatient Hospital Stay: Admit: 2018-04-19 | Payer: Self-pay | Admitting: Orthopedic Surgery

## 2018-04-19 SURGERY — ARTHROPLASTY, HIP, TOTAL, ANTERIOR APPROACH
Anesthesia: Spinal | Laterality: Left

## 2018-05-04 ENCOUNTER — Inpatient Hospital Stay (INDEPENDENT_AMBULATORY_CARE_PROVIDER_SITE_OTHER): Payer: Self-pay | Admitting: Orthopedic Surgery

## 2018-06-12 ENCOUNTER — Inpatient Hospital Stay: Admit: 2018-06-12 | Payer: Self-pay | Admitting: Orthopedic Surgery

## 2018-06-12 SURGERY — ARTHROPLASTY, HIP, TOTAL, ANTERIOR APPROACH
Anesthesia: Spinal | Laterality: Left

## 2018-06-27 ENCOUNTER — Inpatient Hospital Stay (INDEPENDENT_AMBULATORY_CARE_PROVIDER_SITE_OTHER): Payer: Self-pay | Admitting: Orthopedic Surgery

## 2018-07-20 ENCOUNTER — Telehealth: Payer: Self-pay | Admitting: Orthopedic Surgery

## 2018-07-20 NOTE — Telephone Encounter (Signed)
Received vm from [place] at [facility] checking on an opinion letter that was faxed 4/22. IC her back [phone] and advised we have not received letter. I told her she could re fax however, generally Dr August Saucer doesn't do opinion letters as he feels the ov notes are sufficient. She voiced understanding and is going to re fax and that is the attorneys instructions

## 2018-08-21 ENCOUNTER — Telehealth: Payer: Self-pay

## 2018-08-21 NOTE — Telephone Encounter (Signed)
Case mgr emailed to introduce herself and give her contact info  I am the telephonic nurse case manager for Charles Benson. DOB: 2060-03-20. My phone and fax is below, if you could kindly add my information to his file, as I will be following up post-op after sx 6-25.     Thanks, Tod Persia, RN, BSN, CCM, [facility] Senior Nurse Case Cabin crew Office: [phone] Fax: [phone] [email or web address]

## 2018-08-25 ENCOUNTER — Other Ambulatory Visit (HOSPITAL_COMMUNITY): Payer: Self-pay

## 2018-08-25 IMAGING — CR DG KNEE COMPLETE 4+V*L*
4 series · 4 of 4 positions shown · non-contrast
Comparison: None.

CLINICAL DATA: Hit by falling wall.

EXAM:
LEFT KNEE - COMPLETE 4+ VIEW

[knee ap]
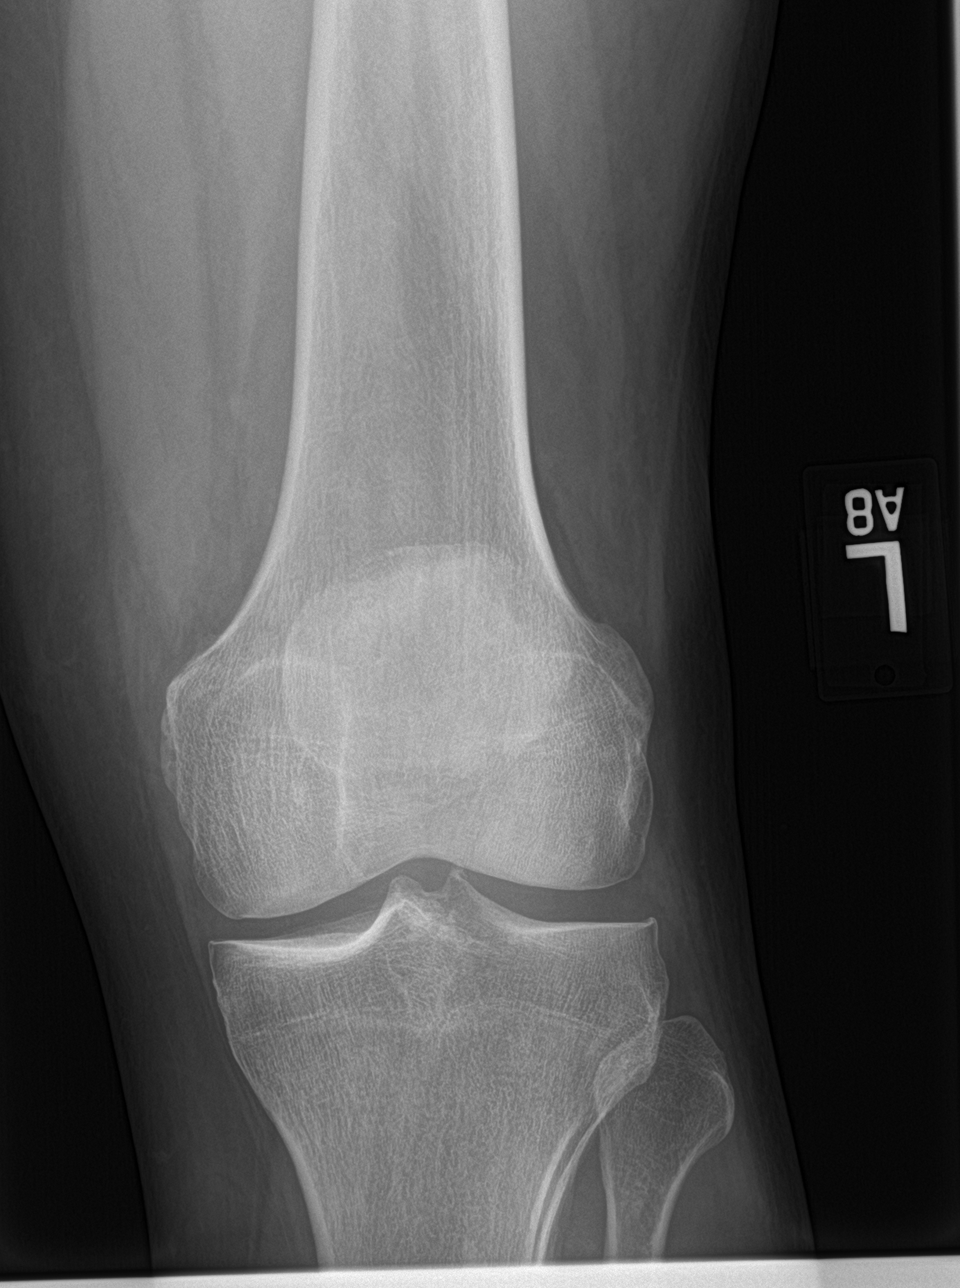

[knee lat]
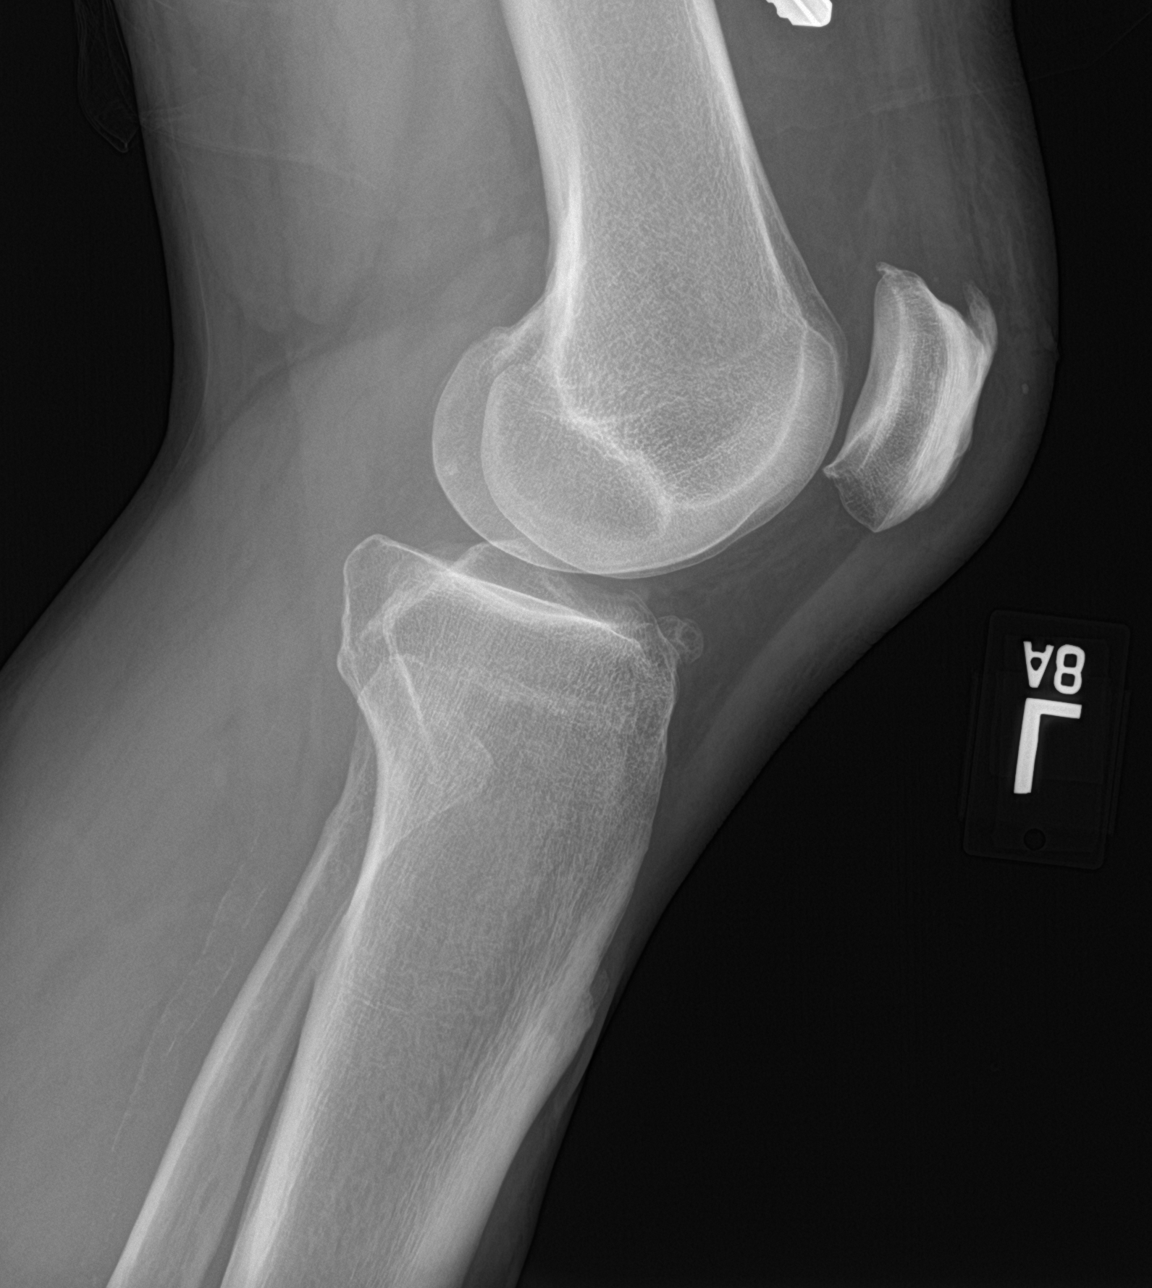

[knee obl (1 of 2)]
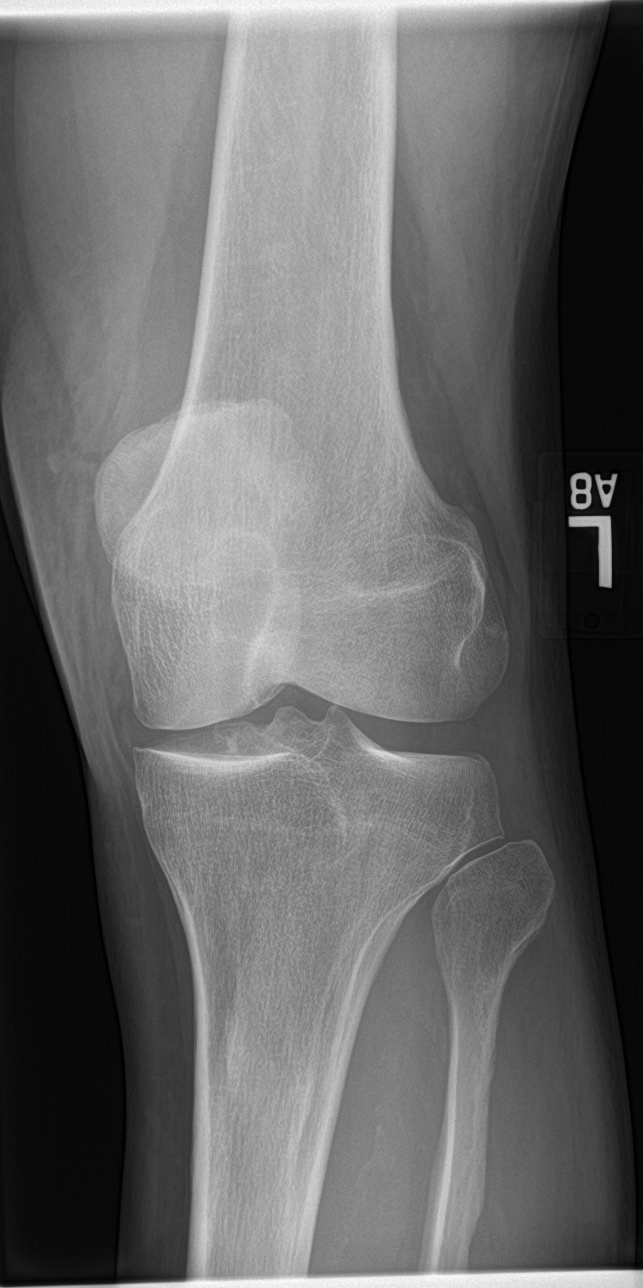

[knee obl (2 of 2)]
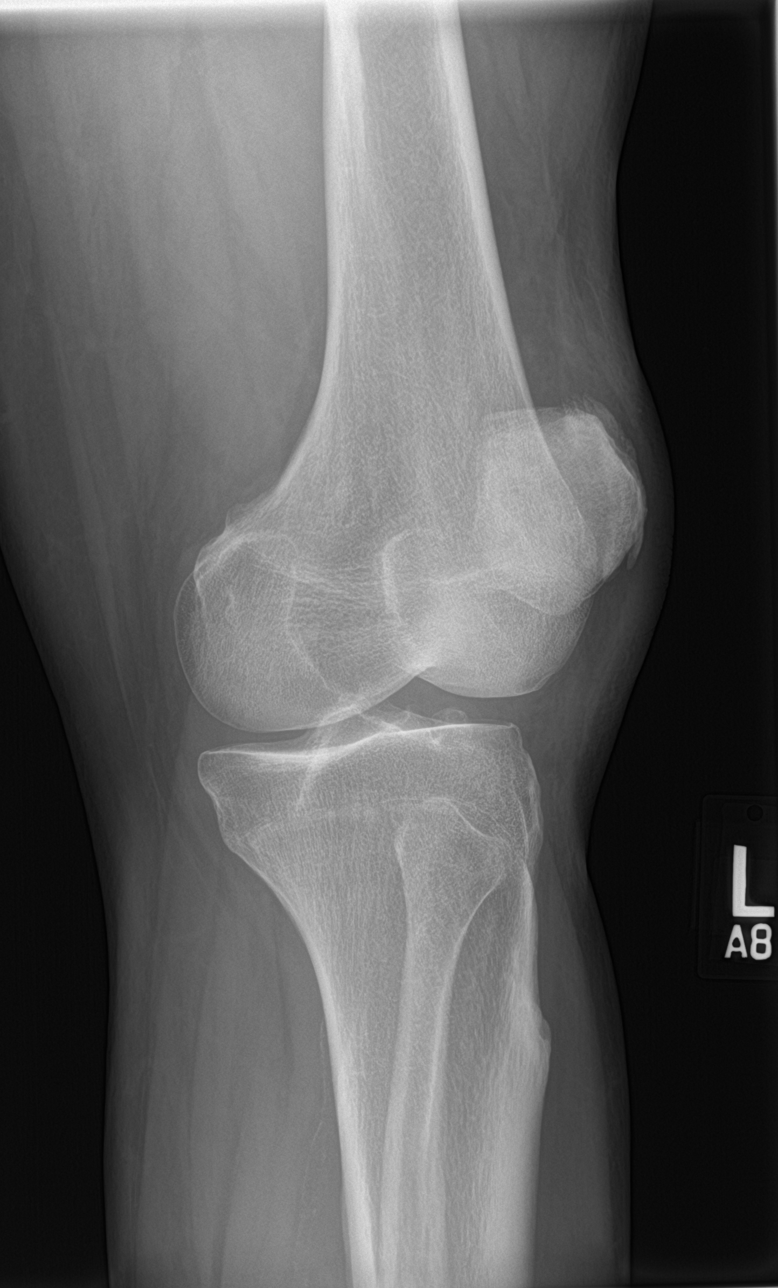

[4 of 4 positions shown; findings below may reference images not displayed]

FINDINGS: No evidence of fracture, dislocation, or joint effusion. Mild medial
compartment narrowing. Sharpening of the tibial spines noted. There
is a os ossific density within the anterior joint space which may
represent a loose body measuring 9 mm. Soft tissues are
unremarkable.
IMPRESSION: 1. Osteoarthritis.
2. Small os ossific density within the anterior joint space may
represent a loose body.

## 2018-08-25 IMAGING — CR DG LUMBAR SPINE COMPLETE 4+V
5 series · 5 of 5 positions shown · non-contrast
Comparison: 10/10/2014.

CLINICAL DATA: Injury.

EXAM:
LUMBAR SPINE - COMPLETE 4+ VIEW

[l-spine ap]
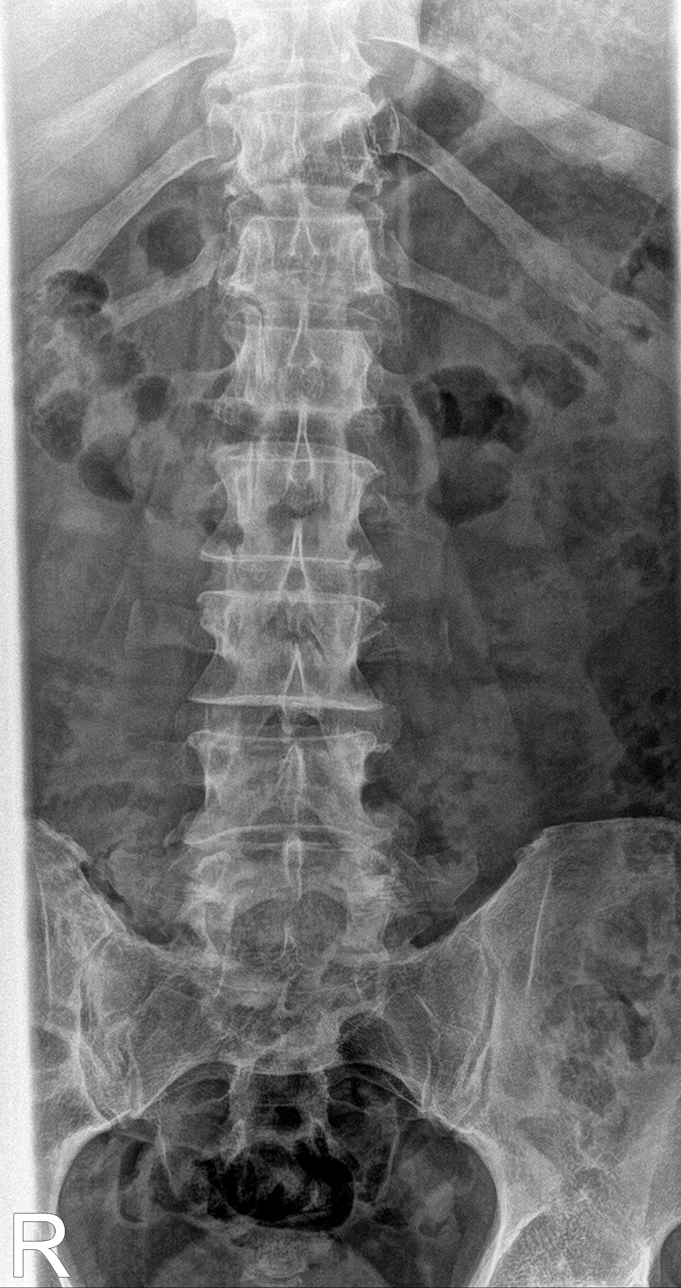

[l-spine obl (1 of 2)]
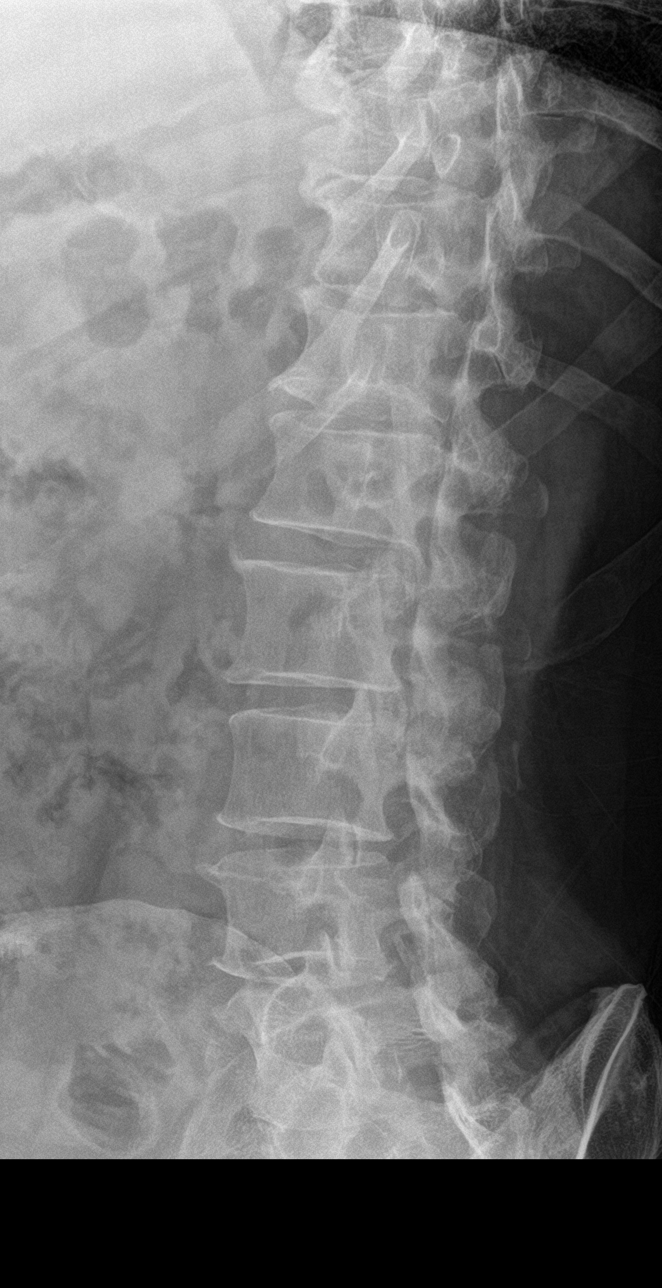

[l-spine obl (2 of 2)]
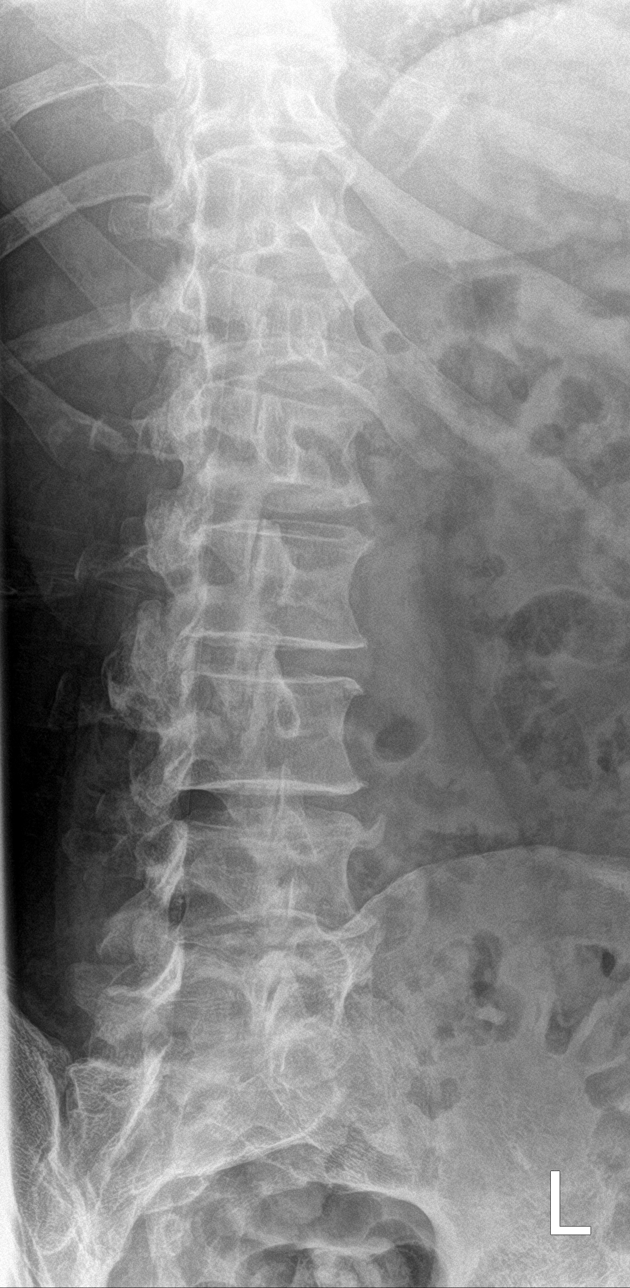

[l-spine lat]
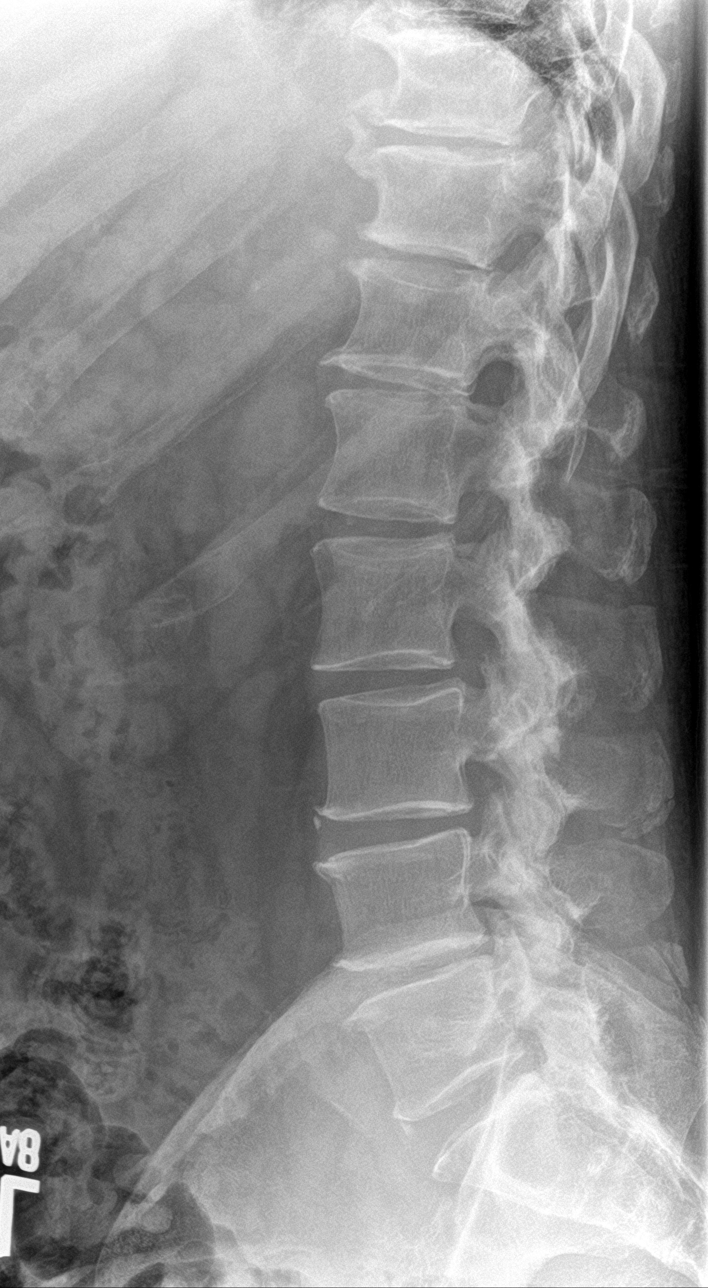

[l-spine spot]
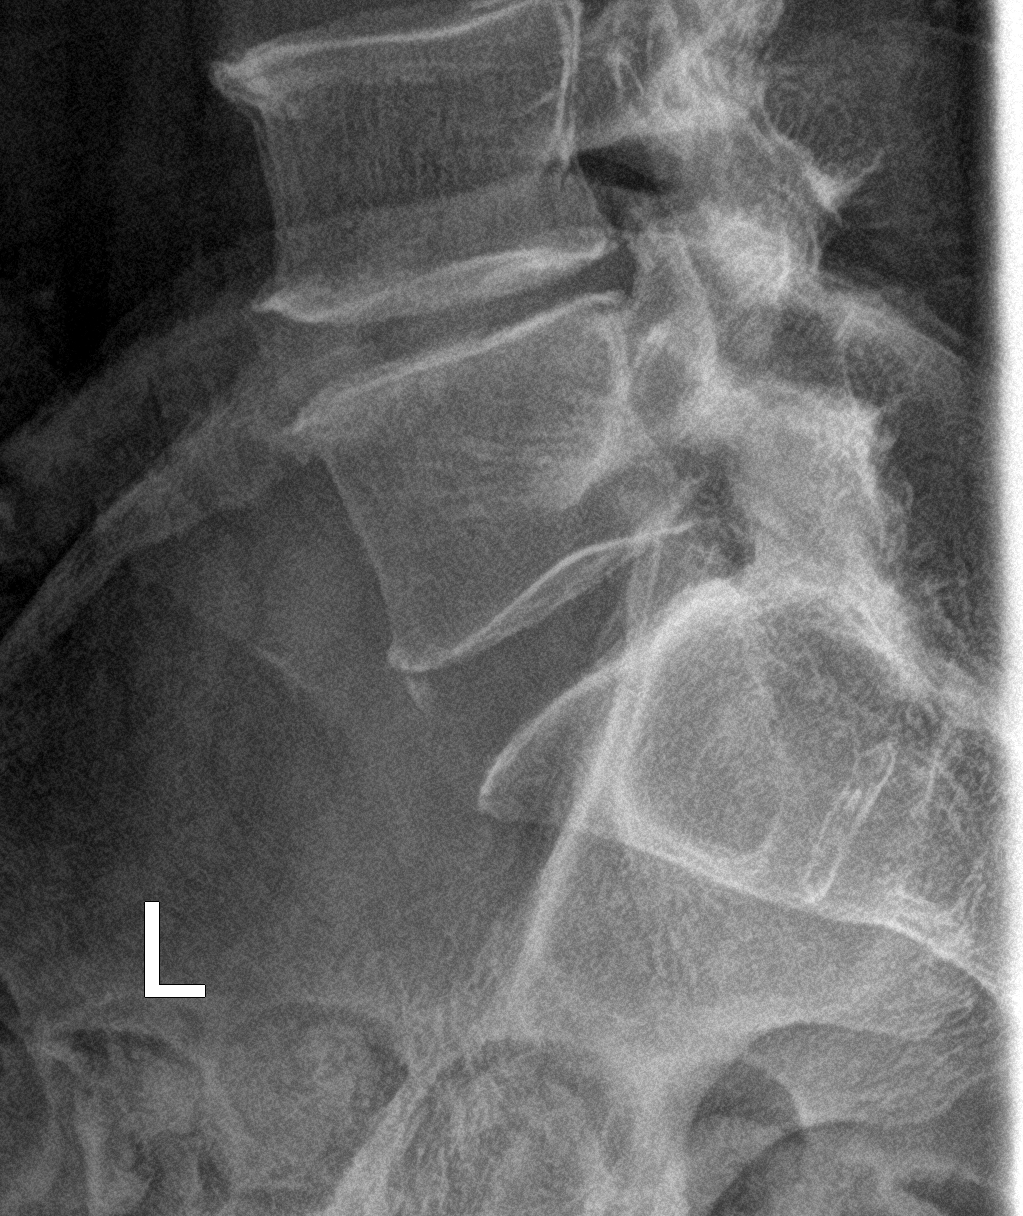

[5 of 5 positions shown; findings below may reference images not displayed]

FINDINGS: No acute bony abnormality identified. Normal alignment and
mineralization. Diffuse degenerative change.
IMPRESSION: No acute or focal abnormality.  Diffuse degenerative change .

## 2018-08-27 ENCOUNTER — Other Ambulatory Visit (HOSPITAL_COMMUNITY)
Admission: RE | Admit: 2018-08-27 | Discharge: 2018-08-27 | Disposition: A | Discharge status: Home / Self Care | Payer: HRSA Program | Source: Ambulatory Visit | Attending: Orthopedic Surgery | Admitting: Orthopedic Surgery

## 2018-08-27 ENCOUNTER — Other Ambulatory Visit (HOSPITAL_COMMUNITY): Payer: Self-pay

## 2018-08-27 DIAGNOSIS — Z1159 Encounter for screening for other viral diseases: Secondary | ICD-10-CM | POA: Diagnosis not present

## 2018-08-27 LAB — SARS CORONAVIRUS 2 (TAT 6-24 HRS): SARS Coronavirus 2: NEGATIVE

## 2018-08-29 ENCOUNTER — Telehealth: Payer: Self-pay | Admitting: Orthopedic Surgery

## 2018-08-29 ENCOUNTER — Other Ambulatory Visit: Payer: Self-pay

## 2018-08-29 ENCOUNTER — Encounter (HOSPITAL_COMMUNITY)
Admission: RE | Admit: 2018-08-29 | Discharge: 2018-08-29 | Disposition: A | Discharge status: Home / Self Care | Payer: PRIVATE HEALTH INSURANCE | Source: Ambulatory Visit | Attending: Orthopedic Surgery | Admitting: Orthopedic Surgery

## 2018-08-29 ENCOUNTER — Encounter (HOSPITAL_COMMUNITY): Payer: Self-pay

## 2018-08-29 DIAGNOSIS — Z79899 Other long term (current) drug therapy: Secondary | ICD-10-CM | POA: Insufficient documentation

## 2018-08-29 DIAGNOSIS — Z01818 Encounter for other preprocedural examination: Secondary | ICD-10-CM | POA: Diagnosis present

## 2018-08-29 DIAGNOSIS — I10 Essential (primary) hypertension: Secondary | ICD-10-CM | POA: Diagnosis not present

## 2018-08-29 DIAGNOSIS — Z791 Long term (current) use of non-steroidal anti-inflammatories (NSAID): Secondary | ICD-10-CM | POA: Diagnosis not present

## 2018-08-29 DIAGNOSIS — M1612 Unilateral primary osteoarthritis, left hip: Secondary | ICD-10-CM | POA: Insufficient documentation

## 2018-08-29 HISTORY — DX: Unspecified osteoarthritis, unspecified site: M19.90

## 2018-08-29 LAB — CBC
HCT: 44.4 % (ref 39.0–52.0)
Hemoglobin: 14.4 g/dL (ref 13.0–17.0)
MCH: 31.9 pg (ref 26.0–34.0)
MCHC: 32.4 g/dL (ref 30.0–36.0)
MCV: 98.2 fL (ref 80.0–100.0)
Platelets: 234 10*3/uL (ref 150–400)
RBC: 4.52 MIL/uL (ref 4.22–5.81)
RDW: 13.4 % (ref 11.5–15.5)
WBC: 6.2 10*3/uL (ref 4.0–10.5)
nRBC: 0 % (ref 0.0–0.2)

## 2018-08-29 LAB — SURGICAL PCR SCREEN
MRSA, PCR: NEGATIVE
Staphylococcus aureus: POSITIVE — AB

## 2018-08-29 LAB — URINALYSIS, ROUTINE W REFLEX MICROSCOPIC
Bilirubin Urine: NEGATIVE
Glucose, UA: NEGATIVE mg/dL
Hgb urine dipstick: NEGATIVE
Ketones, ur: NEGATIVE mg/dL
Leukocytes,Ua: NEGATIVE
Nitrite: NEGATIVE
Protein, ur: NEGATIVE mg/dL
Specific Gravity, Urine: 1.008 (ref 1.005–1.030)
pH: 5 (ref 5.0–8.0)

## 2018-08-29 LAB — BASIC METABOLIC PANEL
Anion gap: 9 (ref 5–15)
BUN: 12 mg/dL (ref 6–20)
CO2: 23 mmol/L (ref 22–32)
Calcium: 9.2 mg/dL (ref 8.9–10.3)
Chloride: 107 mmol/L (ref 98–111)
Creatinine, Ser: 0.95 mg/dL (ref 0.61–1.24)
GFR calc Af Amer: >60 mL/min (ref >60)
GFR calc non Af Amer: >60 mL/min (ref >60)
Glucose, Bld: 80 mg/dL (ref 70–99)
Potassium: 4 mmol/L (ref 3.5–5.1)
Sodium: 139 mmol/L (ref 135–145)

## 2018-08-29 NOTE — Progress Notes (Addendum)
Patient's Surgical PCR was positive for Staph.  Patient's surgery 08/31/18 at 1041.  Will treat on DOS with Profend.  Called patient and informed him of results.

## 2018-08-29 NOTE — Telephone Encounter (Signed)
Pre-admission testing calling [phone].  I spoke with Pam.  Patient was scheduled 08-30-18   (tomorrow morning) for left total hip anterior approach.  Patient did not quarantine as instructed and was told he would need to be rescheduled and retested.  Preadmission is scheduling the covid test for the patient on Saturday, June 27th.  Patient has been instructed to stay home and his hip surgery has been rescheduled to 09-04-18 @10 :41am.    Note:  Patient also had a BP of 175/90

## 2018-08-29 NOTE — Telephone Encounter (Signed)
Ok thx.

## 2018-08-29 NOTE — Progress Notes (Addendum)
Pt expressed he did not quarantine after being COVID tested on Mon 6/22, even though he was told to. He has worked [employer] after being tested. Dr. Randel Pigg office made aware, and the surgery will be rescheduled, and the pt will be retested on Sat 6/27.

## 2018-08-29 NOTE — Progress Notes (Signed)
Notified  by PAT nurse , patient states he went to work the past two days and did not quarantine after his COVID testing. Dr Randel Pigg office notified, office to reschedule case to Tuesday 6/30 and notify patient of time. We will make the retest appointment for Saturday 09/01/2018.

## 2018-08-29 NOTE — Anesthesia Preprocedure Evaluation (Addendum)
Anesthesia Evaluation    Airway Mallampati: I  TM Distance: >3 FB Neck ROM: Full    Dental   Pulmonary    Pulmonary exam normal        Cardiovascular hypertension, Pt. on medications Normal cardiovascular exam     Neuro/Psych    GI/Hepatic   Endo/Other    Renal/GU      Musculoskeletal   Abdominal   Peds  Hematology   Anesthesia Other Findings   Reproductive/Obstetrics                            Anesthesia Physical Anesthesia Plan  ASA: II  Anesthesia Plan: Spinal   Post-op Pain Management:    Induction: Intravenous  PONV Risk Score and Plan: 1 and Ondansetron  Airway Management Planned: Simple Face Mask  Additional Equipment:   Intra-op Plan:   Post-operative Plan:   Informed Consent: I have reviewed the patients History and Physical, chart, labs and discussed the procedure including the risks, benefits and alternatives for the proposed anesthesia with the patient or authorized representative who has indicated his/her understanding and acceptance.       Plan Discussed with: CRNA and Surgeon  Anesthesia Plan Comments: (PAT note written by Myra Gianotti, PA-C. )       Anesthesia Quick Evaluation

## 2018-08-29 NOTE — Progress Notes (Addendum)
Anesthesia Chart Review:  Case: [id number] Date/Time: 09/04/18 1026   Procedure: LEFT TOTAL HIP ARTHROPLASTY ANTERIOR APPROACH (Left )   Anesthesia type: Spinal   Pre-op diagnosis: left hip osteoarthritis   Location: [facility] OR ROOM 06 / [place] [facility]   Surgeon: Meredith Pel, MD      DISCUSSION: Patient is a 58 year old male scheduled for the above procedure. Surgery was initially scheduled for 08/31/18, but was postponed because he did not quarantine after his presurgical COVID 19 test. He is scheduled for repeat testing 09/01/18 and again instructed on quarantining until surgery after testing.   PAT BP readings: 175/97 172/93 162/104 Reviewed BP readings with anesthesiologist Oren Bracket, MD and also that patient does not have a PCP. If BP readings are stable or better on the day of surgery then would anticipate that he can proceed as planned; however, a significantly higher reading may result in case delay or cancellation. PAT RN notified patient of this. Dr. Marlou Sa also aware that patient had elevated BP at PAT--I will also send him a staff message.   He denied chest pain, SOB, and fever at PAT. EKG and labs WNL. He will get vitals on arrival the day of surgery, and definitive anesthesia plan following anesthesiologist evaluation.   ADDENDUM 08/31/18 5:27 PM: Dr. Marlou Sa had patient see Hilts, Legrand Como, MD today for BP check and evaluation prior to surgery. BP was 193/109. He was started on amlodipine daily and can increase to 10 mg if needed. Patient to contact Dr. Junius Roads next week if BP numbers not improving. He wrote, "I think it is safe for him to proceed with surgery next week as long as his blood pressure is improved."   VS: BP (!) 162/104 Comment: taken manually in right arm  Pulse 72   Temp 37 C   Resp 20   Ht 6\' 1"  (1.854 m)   Wt 102.5 kg   SpO2 97%   BMI 29.82 kg/m   PROVIDERS: Patient, No Pcp Per   LABS: Labs reviewed: Acceptable for surgery. UA WNL, urine culture in  process.  (all labs ordered are listed, but only abnormal results are displayed)  Labs Reviewed  SURGICAL PCR SCREEN - Abnormal; Notable for the following components:      Result Value   Staphylococcus aureus POSITIVE (*)    All other components within normal limits  URINE CULTURE  BASIC METABOLIC PANEL  CBC  URINALYSIS, ROUTINE W REFLEX MICROSCOPIC    EKG: 08/29/18: NSR   CV: N/A  Past Medical History:  Diagnosis Date  . Arthritis   . Hypertension     Past Surgical History:  Procedure Laterality Date  . ANTERIOR CERVICAL DECOMP/DISCECTOMY FUSION Bilateral 10/10/2014   Procedure: ANTERIOR CERVICAL DECOMPRESSION/DISCECTOMY CERVICAL FIVE-SIX;  Surgeon: Jovita Gamma, MD;  Location: [facility];  Service: Neurosurgery;  Laterality: Bilateral;  . TONSILLECTOMY    . WRIST SURGERY      MEDICATIONS: . cyclobenzaprine (FLEXERIL) 10 MG tablet  . HYDROcodone-acetaminophen (NORCO/VICODIN) 5-325 MG per tablet  . Multiple Vitamins-Minerals (MULTIVITAMIN WITH MINERALS) tablet  . nabumetone (RELAFEN) 500 MG tablet  . oxyCODONE-acetaminophen (PERCOCET/ROXICET) 5-325 MG tablet  . Zinc Oxide 10 % OINT   No current facility-administered medications for this encounter.     Myra Gianotti, PA-C Surgical Short Stay/Anesthesiology [facility] Phone [phone] [facility] Phone [phone] 08/29/2018 7:57 PM

## 2018-08-29 NOTE — Progress Notes (Signed)
[id number] [place] - [id number] [place] [address] Phone: [phone] Fax: [phone]  [id number] [place]- [place], [place] - [address] AT [facility] [address] [place] [address] Phone: [phone] Fax: [phone]      Your procedure is scheduled on June 25  [facility]A" at 0900 A.M., and check in at the Admitting office.  Call this number if you have problems the morning of surgery:  [phone]  Call [phone] if you have any questions prior to your surgery date Monday-Friday 8am-4pm    Remember:  Do not eat  after midnight.  You may drink clear liquids until 0900 am the morning of surgery   Clear liquids allowed are: Water, Non-Citrus Juices (without pulp), Carbonated Beverages, Clear Tea, Black Coffee Only, and Gatorade  Please complete your PRE-SURGERY ENSURE that was provided to you by 0900 am the morning of surgery.  Please, if able, drink it in one setting. DO NOT SIP.     Take these medicines the morning of surgery with A SIP OF WATER  nabumetone (RELAFEN)  7 days prior to surgery STOP taking any Aspirin (unless otherwise instructed by your surgeon), Aleve, Naproxen, Ibuprofen, Motrin, Advil, Goody's, BC's, all herbal medications, fish oil, and all vitamins.    The Morning of Surgery  Do not wear jewelry, make-up or nail polish.  Do not wear lotions, powders, or perfumes/colognes, or deodorant  Do not shave 48 hours prior to surgery.  Men may shave face and neck.  Do not bring valuables to the hospital.  [facility] is not responsible for any belongings or valuables.  If you are a smoker, DO NOT Smoke 24 hours prior to surgery IF you wear a CPAP at night please bring your mask, tubing, and machine the morning of surgery   Remember that you must have someone to transport you home after your surgery, and remain with you  for 24 hours if you are discharged the same day.   Contacts, glasses, hearing aids, dentures or bridgework may not be worn into surgery.    Leave your suitcase in the car.  After surgery it may be brought to your room.  For patients admitted to the hospital, discharge time will be determined by your treatment team.  Patients discharged the day of surgery will not be allowed to drive home.    Special instructions:   [facility]- Preparing For Surgery  Before surgery, you can play an important role. Because skin is not sterile, your skin needs to be as free of germs as possible. You can reduce the number of germs on your skin by washing with CHG (chlorahexidine gluconate) Soap before surgery.  CHG is an antiseptic cleaner which kills germs and bonds with the skin to continue killing germs even after washing.    Oral Hygiene is also important to reduce your risk of infection.  Remember - BRUSH YOUR TEETH THE MORNING OF SURGERY WITH YOUR REGULAR TOOTHPASTE  Please do not use if you have an allergy to CHG or antibacterial soaps. If your skin becomes reddened/irritated stop using the CHG.  Do not shave (including legs and underarms) for at least 48 hours prior to first CHG shower. It is OK to shave your face.  Please follow these instructions carefully.   1. Shower the NIGHT BEFORE SURGERY and the MORNING OF SURGERY with  CHG Soap.   2. If you chose to wash your hair, wash your hair first as usual with your normal shampoo.  3. After you shampoo, rinse your hair and body thoroughly to remove the shampoo.  4. Use CHG as you would any other liquid soap. You can apply CHG directly to the skin and wash gently with a scrungie or a clean washcloth.   5. Apply the CHG Soap to your body ONLY FROM THE NECK DOWN.  Do not use on open wounds or open sores. Avoid contact with your eyes, ears, mouth and genitals (private parts). Wash Face and genitals (private parts)  with your normal soap.   6. Wash  thoroughly, paying special attention to the area where your surgery will be performed.  7. Thoroughly rinse your body with warm water from the neck down.  8. DO NOT shower/wash with your normal soap after using and rinsing off the CHG Soap.  9. Pat yourself dry with a CLEAN TOWEL.  10. Wear CLEAN PAJAMAS to bed the night before surgery, wear comfortable clothes the morning of surgery  11. Place CLEAN SHEETS on your bed the night of your first shower and DO NOT SLEEP WITH PETS.    Day of Surgery:  Do not apply any deodorants/lotions.  Please wear clean clothes to the hospital/surgery center.   Remember to brush your teeth WITH YOUR REGULAR TOOTHPASTE.   Please read over the following fact sheets that you were given.

## 2018-08-29 NOTE — Telephone Encounter (Signed)
FYI

## 2018-08-29 NOTE — Progress Notes (Signed)
PCP - Denies  Cardiologist - Denies  Chest x-ray - Denies  EKG - 08/29/2018  Stress Test - Denies  ECHO - Denies  Cardiac Cath - Denies  AICD- na PM- na LOOP- na  Sleep Study - Denies CPAP - None  LABS- 08/29/2018: CBC, BMP, UA, UC, PCR  ASA- Denies  ERAS- 1 drink given   Anesthesia- Yes- elevated bp, pt takes no medicine, but does have a known history.  Pt denies having chest pain, sob, or fever at this time. All instructions explained to the pt, with a verbal understanding of the material. Pt agrees to go over the instructions while at home for a better understanding. The opportunity to ask questions was provided.   Coronavirus Screening  Have you experienced the following symptoms:  Cough yes/no: No Fever (>100.53F)  yes/no: No Runny nose yes/no: No Sore throat yes/no: No Difficulty breathing/shortness of breath  yes/no: No  Have you or a family member traveled in the last 14 days and where? yes/no: No   If the patient indicates "YES" to the above questions, their PAT will be rescheduled to limit the exposure to others and, the surgeon will be notified. THE PATIENT WILL NEED TO BE ASYMPTOMATIC FOR 14 DAYS.   If the patient is not experiencing any of these symptoms, the PAT nurse will instruct them to NOT bring anyone with them to their appointment since they may have these symptoms or traveled as well.   Please remind your patients and families that hospital visitation restrictions are in effect and the importance of the restrictions.

## 2018-08-30 ENCOUNTER — Inpatient Hospital Stay: Admit: 2018-08-30 | Payer: Self-pay | Admitting: Orthopedic Surgery

## 2018-08-30 LAB — URINE CULTURE: Culture: <10000 — AB

## 2018-08-30 SURGERY — ARTHROPLASTY, HIP, TOTAL, ANTERIOR APPROACH
Anesthesia: Spinal | Laterality: Left

## 2018-08-31 ENCOUNTER — Encounter: Payer: Self-pay | Admitting: Family Medicine

## 2018-08-31 ENCOUNTER — Ambulatory Visit (INDEPENDENT_AMBULATORY_CARE_PROVIDER_SITE_OTHER): Payer: PRIVATE HEALTH INSURANCE | Admitting: Family Medicine

## 2018-08-31 ENCOUNTER — Telehealth: Payer: Self-pay

## 2018-08-31 ENCOUNTER — Other Ambulatory Visit: Payer: Self-pay

## 2018-08-31 DIAGNOSIS — I10 Essential (primary) hypertension: Secondary | ICD-10-CM | POA: Diagnosis not present

## 2018-08-31 MED ORDER — AMLODIPINE BESYLATE 10 MG PO TABS: ORAL_TABLET | ORAL | 6 refills | Status: AC

## 2018-08-31 MED ORDER — MAGNESIUM 400 MG PO CAPS: 400.0000 mg | ORAL_CAPSULE | Freq: Every day | ORAL | 3 refills | Status: AC

## 2018-08-31 NOTE — Telephone Encounter (Signed)
I have called patient twice trying to get him in with Dr Junius Roads today for BP and have been unable to reach him. I have left him message.  FYI

## 2018-08-31 NOTE — Progress Notes (Signed)
Office Visit Note   Patient: Charles Benson           Date of Birth: 07/30/1960           MRN: [id number] Visit Date: 08/31/2018 Requested by: No referring provider defined for this encounter. PCP: Patient, No Pcp Per  Subjective: Chief Complaint  Patient presents with  . Blood Pressure Check    HPI: He is here for blood pressure management.  He is scheduled for left hip replacement next Tuesday.  During his preoperative evaluation, his blood pressure was elevated.  EKG was unremarkable.  Patient states that in the past his blood pressure has been elevated, but he was able to keep it in normal range with jogging for exercise and healthy diet.  Because of his hip problem he is no longer able to exercise like he used to.  He has gained about 40 pounds over his previous weight.  He thinks these factors have contributed to his blood pressure elevation.  Denies any headaches but he does feel "different" in the head when his blood pressure is high.  Denies any chest pain or palpitations.  He does get bilateral leg edema at the end of the day from standing long periods of time at work.  Patient is not diabetic.  Recent labs showed normal metabolic panel.               ROS: No fevers or chills.  All other systems were reviewed and are negative.  Objective: Vital Signs: BP (!) 193/109 (BP Location: Right Arm, Patient Position: Sitting, Cuff Size: Large)   Pulse 80   Physical Exam:  General:  Alert and oriented, in no acute distress. Pulm:  Breathing unlabored. Psy:  Normal mood, congruent affect. Skin: No rash on the skin. CV: Regular rate and rhythm without murmurs, rubs, or gallops.  1+ peripheral edema.  2+ radial and posterior tibial pulses. Lungs: Clear to auscultation throughout with no wheezing or areas of consolidation.    Imaging: None today.  Assessment & Plan: 1.  Essential hypertension -We will treat with amlodipine starting with 5 mg daily, increasing to 10 mg if  needed.  He will start taking magnesium 400 mg daily.  He will check his blood pressure daily at first.  He will notify me next week if his numbers are not improving. -I think it is safe for him to proceed with surgery next week as long as his blood pressure is improved. -Once he recovers from surgery, if he can start some sort of exercise regimen and watch his diet, lose some weight, there is a chance he may be able to eventually come off medication.  I will see him back in about 4 weeks, sooner for any problems.     Procedures: No procedures performed  No notes on file     PMFS History: Patient Active Problem List   Diagnosis Date Noted  . Essential hypertension 08/31/2018  . 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 Arthritis   . Hypertension     History reviewed. No pertinent family history.  Past Surgical History:  Procedure Laterality Date  . ANTERIOR CERVICAL DECOMP/DISCECTOMY FUSION Bilateral 10/10/2014   Procedure: ANTERIOR CERVICAL DECOMPRESSION/DISCECTOMY CERVICAL FIVE-SIX;  Surgeon: Shirlean Kellyobert Nudelman, MD;  Location: [facility];  Service: Neurosurgery;  Laterality: Bilateral;  . TONSILLECTOMY    . WRIST SURGERY     Social History   Occupational History  . Not on file  Tobacco Use  . Smoking status: Never Smoker  . Smokeless tobacco: Never Used  Substance and Sexual Activity  . Alcohol use: Yes    Comment: occ  . Drug use: No  . Sexual activity: Not on file

## 2018-08-31 NOTE — Telephone Encounter (Signed)
Thanks for all you do:)

## 2018-08-31 NOTE — Telephone Encounter (Signed)
Per Dr Marlou Sa,  Tried calling patient to try to get him worked in with Dr Junius Roads this afternoon since he does not have PCP and he has elevated BP readings at his preop appt. Patient did not answer but I LM for him to please call back as he would need to be seen this afternoon by Dr Junius Roads  Dr Marlou Sa s/w Dr Junius Roads and he was agreeable to see patient this afternoon since he is scheduled for surgery on Tuesday 06/30.

## 2018-09-01 ENCOUNTER — Other Ambulatory Visit (HOSPITAL_COMMUNITY)
Admission: RE | Admit: 2018-09-01 | Discharge: 2018-09-01 | Disposition: A | Discharge status: Home / Self Care | Payer: HRSA Program | Source: Ambulatory Visit | Attending: Orthopedic Surgery | Admitting: Orthopedic Surgery

## 2018-09-01 DIAGNOSIS — Z1159 Encounter for screening for other viral diseases: Secondary | ICD-10-CM | POA: Insufficient documentation

## 2018-09-01 LAB — SARS CORONAVIRUS 2 (TAT 6-24 HRS): SARS Coronavirus 2: NEGATIVE

## 2018-09-04 ENCOUNTER — Ambulatory Visit (HOSPITAL_COMMUNITY): Payer: PRIVATE HEALTH INSURANCE

## 2018-09-04 ENCOUNTER — Inpatient Hospital Stay (HOSPITAL_COMMUNITY): Payer: PRIVATE HEALTH INSURANCE | Admitting: Vascular Surgery

## 2018-09-04 ENCOUNTER — Other Ambulatory Visit: Payer: Self-pay

## 2018-09-04 ENCOUNTER — Inpatient Hospital Stay (HOSPITAL_COMMUNITY)
Admission: RE | Admit: 2018-09-04 | Discharge: 2018-09-06 | DRG: 470 | Disposition: A | Discharge status: Home / Self Care | Payer: PRIVATE HEALTH INSURANCE | Attending: Orthopedic Surgery | Admitting: Orthopedic Surgery

## 2018-09-04 ENCOUNTER — Encounter (HOSPITAL_COMMUNITY)
Admission: RE | Disposition: A | Discharge status: Home / Self Care | Payer: Self-pay | Source: Home / Self Care | Attending: Orthopedic Surgery

## 2018-09-04 ENCOUNTER — Inpatient Hospital Stay (HOSPITAL_COMMUNITY): Payer: PRIVATE HEALTH INSURANCE | Admitting: Certified Registered Nurse Anesthetist

## 2018-09-04 ENCOUNTER — Inpatient Hospital Stay (HOSPITAL_COMMUNITY): Payer: PRIVATE HEALTH INSURANCE

## 2018-09-04 ENCOUNTER — Encounter (HOSPITAL_COMMUNITY): Payer: Self-pay

## 2018-09-04 DIAGNOSIS — Z9181 History of falling: Secondary | ICD-10-CM

## 2018-09-04 DIAGNOSIS — Z419 Encounter for procedure for purposes other than remedying health state, unspecified: Secondary | ICD-10-CM

## 2018-09-04 DIAGNOSIS — M1612 Unilateral primary osteoarthritis, left hip: Principal | ICD-10-CM | POA: Diagnosis present

## 2018-09-04 DIAGNOSIS — M1610 Unilateral primary osteoarthritis, unspecified hip: Secondary | ICD-10-CM | POA: Diagnosis present

## 2018-09-04 DIAGNOSIS — I10 Essential (primary) hypertension: Secondary | ICD-10-CM | POA: Diagnosis present

## 2018-09-04 HISTORY — PX: TOTAL HIP ARTHROPLASTY: SHX124

## 2018-09-04 SURGERY — ARTHROPLASTY, HIP, TOTAL, ANTERIOR APPROACH
Anesthesia: Spinal | Laterality: Left

## 2018-09-04 MED ORDER — CHLORHEXIDINE GLUCONATE 4 % EX LIQD: 60.0000 mL | Freq: Once | CUTANEOUS | Status: DC

## 2018-09-04 MED ORDER — METOCLOPRAMIDE HCL 5 MG PO TABS: 5.0000 mg | ORAL_TABLET | Freq: Three times a day (TID) | ORAL | Status: DC | PRN

## 2018-09-04 MED ORDER — OXYCODONE HCL 5 MG PO TABS
5.0000 mg | ORAL_TABLET | ORAL | Status: DC | PRN
  Administered 2018-09-04 (×2): 5 mg via ORAL
  Administered 2018-09-04 – 2018-09-06 (×6): 10 mg via ORAL
  Filled 2018-09-04: qty 1
  Filled 2018-09-04 (×7): qty 2

## 2018-09-04 MED ORDER — ACETAMINOPHEN 325 MG PO TABS: 325.0000 mg | ORAL_TABLET | Freq: Four times a day (QID) | ORAL | Status: DC | PRN

## 2018-09-04 MED ORDER — CEFAZOLIN SODIUM-DEXTROSE 2-4 GM/100ML-% IV SOLN
2.0000 g | INTRAVENOUS | Status: AC
  Administered 2018-09-04: 2 g via INTRAVENOUS

## 2018-09-04 MED ORDER — SODIUM CHLORIDE 0.9 % IV SOLN
INTRAVENOUS | Status: DC | PRN
  Administered 2018-09-04: 20 ug/min via INTRAVENOUS

## 2018-09-04 MED ORDER — METHOCARBAMOL 1000 MG/10ML IJ SOLN
500.0000 mg | Freq: Four times a day (QID) | INTRAVENOUS | Status: DC | PRN
  Filled 2018-09-04: qty 5

## 2018-09-04 MED ORDER — CEFAZOLIN SODIUM-DEXTROSE 2-4 GM/100ML-% IV SOLN
2.0000 g | Freq: Four times a day (QID) | INTRAVENOUS | Status: AC
  Administered 2018-09-04 (×2): 2 g via INTRAVENOUS
  Filled 2018-09-04 (×2): qty 100

## 2018-09-04 MED ORDER — LACTATED RINGERS IV SOLN
INTRAVENOUS | Status: DC
  Administered 2018-09-04: 09:00:00 via INTRAVENOUS

## 2018-09-04 MED ORDER — OXYCODONE HCL 5 MG PO TABS
ORAL_TABLET | ORAL | Status: AC
  Filled 2018-09-04: qty 1

## 2018-09-04 MED ORDER — HYDROMORPHONE HCL 1 MG/ML IJ SOLN
INTRAMUSCULAR | Status: AC
  Filled 2018-09-04: qty 1

## 2018-09-04 MED ORDER — ONDANSETRON HCL 4 MG/2ML IJ SOLN: 4.0000 mg | Freq: Four times a day (QID) | INTRAMUSCULAR | Status: DC | PRN

## 2018-09-04 MED ORDER — DOCUSATE SODIUM 100 MG PO CAPS
100.0000 mg | ORAL_CAPSULE | Freq: Two times a day (BID) | ORAL | Status: DC
  Administered 2018-09-04 – 2018-09-06 (×4): 100 mg via ORAL
  Filled 2018-09-04 (×4): qty 1

## 2018-09-04 MED ORDER — MAGNESIUM OXIDE 400 (241.3 MG) MG PO TABS
400.0000 mg | ORAL_TABLET | Freq: Every day | ORAL | Status: DC
  Administered 2018-09-04 – 2018-09-06 (×3): 400 mg via ORAL
  Filled 2018-09-04 (×3): qty 1

## 2018-09-04 MED ORDER — ASPIRIN 81 MG PO CHEW
81.0000 mg | CHEWABLE_TABLET | Freq: Two times a day (BID) | ORAL | Status: DC
  Administered 2018-09-04 – 2018-09-06 (×4): 81 mg via ORAL
  Filled 2018-09-04 (×4): qty 1

## 2018-09-04 MED ORDER — PROPOFOL 10 MG/ML IV BOLUS
INTRAVENOUS | Status: DC | PRN
  Administered 2018-09-04 (×2): 10 mg via INTRAVENOUS
  Administered 2018-09-04: 30 mg via INTRAVENOUS

## 2018-09-04 MED ORDER — 0.9 % SODIUM CHLORIDE (POUR BTL) OPTIME
TOPICAL | Status: DC | PRN
  Administered 2018-09-04 (×2): 1000 mL

## 2018-09-04 MED ORDER — CELECOXIB 200 MG PO CAPS
200.0000 mg | ORAL_CAPSULE | Freq: Two times a day (BID) | ORAL | Status: DC
  Administered 2018-09-04 – 2018-09-06 (×4): 200 mg via ORAL
  Filled 2018-09-04 (×4): qty 1

## 2018-09-04 MED ORDER — MIDAZOLAM HCL 5 MG/5ML IJ SOLN
INTRAMUSCULAR | Status: DC | PRN
  Administered 2018-09-04: 2 mg via INTRAVENOUS

## 2018-09-04 MED ORDER — MAGNESIUM 400 MG PO CAPS: 400.0000 mg | ORAL_CAPSULE | Freq: Every day | ORAL | Status: DC

## 2018-09-04 MED ORDER — HYDROMORPHONE HCL 1 MG/ML IJ SOLN
0.2500 mg | INTRAMUSCULAR | Status: DC | PRN
  Administered 2018-09-04 (×2): 0.5 mg via INTRAVENOUS

## 2018-09-04 MED ORDER — ONDANSETRON HCL 4 MG PO TABS: 4.0000 mg | ORAL_TABLET | Freq: Four times a day (QID) | ORAL | Status: DC | PRN

## 2018-09-04 MED ORDER — MENTHOL 3 MG MT LOZG: 1.0000 | LOZENGE | OROMUCOSAL | Status: DC | PRN

## 2018-09-04 MED ORDER — TRANEXAMIC ACID-NACL 1000-0.7 MG/100ML-% IV SOLN
1000.0000 mg | INTRAVENOUS | Status: AC
  Administered 2018-09-04: 1000 mg via INTRAVENOUS

## 2018-09-04 MED ORDER — MIDAZOLAM HCL 2 MG/2ML IJ SOLN
INTRAMUSCULAR | Status: AC
  Filled 2018-09-04: qty 2

## 2018-09-04 MED ORDER — METOCLOPRAMIDE HCL 5 MG/ML IJ SOLN: 5.0000 mg | Freq: Three times a day (TID) | INTRAMUSCULAR | Status: DC | PRN

## 2018-09-04 MED ORDER — GABAPENTIN 300 MG PO CAPS
300.0000 mg | ORAL_CAPSULE | Freq: Three times a day (TID) | ORAL | Status: DC
  Administered 2018-09-04 – 2018-09-06 (×6): 300 mg via ORAL
  Filled 2018-09-04 (×6): qty 1

## 2018-09-04 MED ORDER — PROPOFOL 10 MG/ML IV BOLUS
INTRAVENOUS | Status: AC
  Filled 2018-09-04: qty 20

## 2018-09-04 MED ORDER — ADULT MULTIVITAMIN W/MINERALS CH
1.0000 | ORAL_TABLET | Freq: Every day | ORAL | Status: DC
  Administered 2018-09-04 – 2018-09-06 (×3): 1 via ORAL
  Filled 2018-09-04 (×3): qty 1

## 2018-09-04 MED ORDER — BUPIVACAINE IN DEXTROSE 0.75-8.25 % IT SOLN
INTRATHECAL | Status: DC | PRN
  Administered 2018-09-04 (×2): 2 mL via INTRATHECAL

## 2018-09-04 MED ORDER — PROPOFOL 500 MG/50ML IV EMUL
INTRAVENOUS | Status: DC | PRN
  Administered 2018-09-04: 80 ug/kg/min via INTRAVENOUS

## 2018-09-04 MED ORDER — PHENOL 1.4 % MT LIQD: 1.0000 | OROMUCOSAL | Status: DC | PRN

## 2018-09-04 MED ORDER — CEFAZOLIN SODIUM-DEXTROSE 2-4 GM/100ML-% IV SOLN
INTRAVENOUS | Status: AC
  Filled 2018-09-04: qty 100

## 2018-09-04 MED ORDER — AMLODIPINE BESYLATE 5 MG PO TABS
5.0000 mg | ORAL_TABLET | Freq: Every day | ORAL | Status: DC
  Administered 2018-09-05 – 2018-09-06 (×2): 5 mg via ORAL
  Filled 2018-09-04 (×2): qty 1

## 2018-09-04 MED ORDER — GLYCOPYRROLATE PF 0.2 MG/ML IJ SOSY
PREFILLED_SYRINGE | INTRAMUSCULAR | Status: DC | PRN
  Administered 2018-09-04: .1 mg via INTRAVENOUS

## 2018-09-04 MED ORDER — ONDANSETRON HCL 4 MG/2ML IJ SOLN
INTRAMUSCULAR | Status: DC | PRN
  Administered 2018-09-04: 4 mg via INTRAVENOUS

## 2018-09-04 MED ORDER — PROPOFOL 1000 MG/100ML IV EMUL
INTRAVENOUS | Status: AC
  Filled 2018-09-04: qty 100

## 2018-09-04 MED ORDER — METHOCARBAMOL 500 MG PO TABS
500.0000 mg | ORAL_TABLET | Freq: Four times a day (QID) | ORAL | Status: DC | PRN
  Administered 2018-09-04 – 2018-09-06 (×4): 500 mg via ORAL
  Filled 2018-09-04 (×5): qty 1

## 2018-09-04 MED ORDER — TRANEXAMIC ACID-NACL 1000-0.7 MG/100ML-% IV SOLN
INTRAVENOUS | Status: AC
  Filled 2018-09-04: qty 100

## 2018-09-04 MED ORDER — HYDROMORPHONE HCL 1 MG/ML IJ SOLN: 0.5000 mg | INTRAMUSCULAR | Status: DC | PRN

## 2018-09-04 SURGICAL SUPPLY — 61 items
ACETAB CUP W GRIPTION 54MM (Plate) ×1 IMPLANT
ACETAB CUP W/GRIPTION 54 (Plate) ×2 IMPLANT
BAG DECANTER FOR FLEXI CONT (MISCELLANEOUS) ×3 IMPLANT
BLADE CLIPPER SURG (BLADE) ×3 IMPLANT
BLADE SAW SGTL 18X1.27X75 (BLADE) ×2 IMPLANT
BLADE SAW SGTL 18X1.27X75MM (BLADE) ×1
CELLS DAT CNTRL 66122 CELL SVR (MISCELLANEOUS) ×1 IMPLANT
CLOSURE STERI-STRIP 1/2X4 (GAUZE/BANDAGES/DRESSINGS) ×1
CLOSURE WOUND 1/2 X4 (GAUZE/BANDAGES/DRESSINGS) ×1
CLSR STERI-STRIP ANTIMIC 1/2X4 (GAUZE/BANDAGES/DRESSINGS) ×1 IMPLANT
COVER PERINEAL POST (MISCELLANEOUS) ×3 IMPLANT
COVER SURGICAL LIGHT HANDLE (MISCELLANEOUS) ×3 IMPLANT
COVER WAND RF STERILE (DRAPES) ×3 IMPLANT
CUP ACETAB W/GRIPTION 54 (Plate) IMPLANT
DRAPE C-ARM 42X72 X-RAY (DRAPES) ×3 IMPLANT
DRAPE STERI IOBAN 125X83 (DRAPES) ×3 IMPLANT
DRAPE U-SHAPE 47X51 STRL (DRAPES) ×9 IMPLANT
DRSG AQUACEL AG ADV 3.5X 6 (GAUZE/BANDAGES/DRESSINGS) ×2 IMPLANT
DRSG AQUACEL AG ADV 3.5X10 (GAUZE/BANDAGES/DRESSINGS) ×3 IMPLANT
DURAPREP 26ML APPLICATOR (WOUND CARE) ×3 IMPLANT
ELECT BLADE 4.0 EZ CLEAN MEGAD (MISCELLANEOUS) ×3
ELECT REM PT RETURN 9FT ADLT (ELECTROSURGICAL) ×3
ELECTRODE BLDE 4.0 EZ CLN MEGD (MISCELLANEOUS) ×1 IMPLANT
ELECTRODE REM PT RTRN 9FT ADLT (ELECTROSURGICAL) ×1 IMPLANT
GLOVE BIOGEL PI IND STRL 8 (GLOVE) ×1 IMPLANT
GLOVE BIOGEL PI INDICATOR 8 (GLOVE) ×2
GLOVE SURG ORTHO 8.0 STRL STRW (GLOVE) ×6 IMPLANT
GOWN STRL REUS W/ TWL LRG LVL3 (GOWN DISPOSABLE) ×3 IMPLANT
GOWN STRL REUS W/TWL LRG LVL3 (GOWN DISPOSABLE) ×6
HANDPIECE INTERPULSE COAX TIP (DISPOSABLE) ×2
HEAD M SROM 36MM 2 (Hips) IMPLANT
HOOD PEEL AWAY FLYTE STAYCOOL (MISCELLANEOUS) ×9 IMPLANT
JET LAVAGE IRRISEPT WOUND (IRRIGATION / IRRIGATOR) ×3
KIT BASIN OR (CUSTOM PROCEDURE TRAY) ×3 IMPLANT
KIT TURNOVER KIT B (KITS) ×3 IMPLANT
LAVAGE JET IRRISEPT WOUND (IRRIGATION / IRRIGATOR) IMPLANT
LINER NEUTRAL 54X36MM PLUS 4 (Hips) ×2 IMPLANT
NEEDLE HYPO 22GX1.5 SAFETY (NEEDLE) ×3 IMPLANT
NS IRRIG 1000ML POUR BTL (IV SOLUTION) ×9 IMPLANT
PACK TOTAL JOINT (CUSTOM PROCEDURE TRAY) ×3 IMPLANT
PAD ARMBOARD 7.5X6 YLW CONV (MISCELLANEOUS) ×3 IMPLANT
RETRACTOR WND ALEXIS 18 MED (MISCELLANEOUS) ×1 IMPLANT
RTRCTR WOUND ALEXIS 18CM MED (MISCELLANEOUS) ×3
SET HNDPC FAN SPRY TIP SCT (DISPOSABLE) ×1 IMPLANT
SROM M HEAD 36MM 2 (Hips) ×3 IMPLANT
STEM CORAIL KA12 (Stem) ×2 IMPLANT
STRIP CLOSURE SKIN 1/2X4 (GAUZE/BANDAGES/DRESSINGS) ×2 IMPLANT
SUT ETHIBOND NAB CT1 #1 30IN (SUTURE) ×6 IMPLANT
SUT MNCRL AB 3-0 PS2 18 (SUTURE) ×3 IMPLANT
SUT VIC AB 0 CT1 27 (SUTURE) ×6
SUT VIC AB 0 CT1 27XBRD ANBCTR (SUTURE) ×3 IMPLANT
SUT VIC AB 1 CT1 27 (SUTURE) ×6
SUT VIC AB 1 CT1 27XBRD ANBCTR (SUTURE) ×3 IMPLANT
SUT VIC AB 2-0 CT1 27 (SUTURE) ×4
SUT VIC AB 2-0 CT1 TAPERPNT 27 (SUTURE) ×2 IMPLANT
SYR 30ML LL (SYRINGE) ×3 IMPLANT
TOWEL GREEN STERILE FF (TOWEL DISPOSABLE) ×3 IMPLANT
TOWEL OR 17X26 10 PK STRL BLUE (TOWEL DISPOSABLE) ×3 IMPLANT
TRAY FOLEY MTR SLVR 16FR STAT (SET/KITS/TRAYS/PACK) ×3 IMPLANT
WATER STERILE IRR 1000ML POUR (IV SOLUTION) ×3 IMPLANT
YANKAUER SUCT BULB TIP NO VENT (SUCTIONS) ×2 IMPLANT

## 2018-09-04 NOTE — Op Note (Addendum)
NAME: Charles Benson, FRICKE MEDICAL RECORD RW:[id number] ACCOUNT [id number] DATE OF BIRTH:March 15, 1960 FACILITY: [facility] LOCATION: [facility] PHYSICIAN:Resha Filippone Randel Pigg, MD  OPERATIVE REPORT  DATE OF PROCEDURE:  09/04/2018  PREOPERATIVE DIAGNOSIS:  Left hip arthritis.  POSTOPERATIVE DIAGNOSIS:  Left hip arthritis.  PROCEDURE:  Left total hip replacement using J and J components, Corail stem size 12 with 54 mm Pinnacle cup and -2 cobalt chrome ball.  SURGEON:  Meredith Pel, MD  ASSISTANT:  Annie Main pa____   INDICATIONS:  The patient a 58 year old patient with left hip pain refractory to nonoperative management.  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  He presents now for operative management after explanation of risks and benefits.  PROCEDURE IN DETAIL:  The patient was brought to the operating room where general anesthesia was induced.  Preoperative antibiotics administered.  Timeout was called.  The patient was placed on the Hana bed with both feet and the foot holders.  The left  leg was pre-scrubbed with alcohol and Betadine, allowed to air dry.  Prep with DuraPrep solution and draped in a sterile manner.  Ioban used to cover the operative field.  Timeout was called.  Incision was made 2 cm distal and posterior to the anterior  superior iliac crest.  Skin and subcutaneous tissue were sharply divided.  The fascia over the tensor fascia lata was divided.  A plane between the rectus and the tensor fascia lata was developed.  Retractors were placed on the inferior and superior  aspect of the femoral neck.  Capsulotomy was made and tagged with #1 Vicryl suture.  Femoral neck cut was made with oscillating saw confirmed under fluoroscopy.  Head was removed.  At this time, reaming was performed on the acetabulum up to 53 mm.  This  was done under fluoroscopic guidance and approximately 45 degrees of abduction and about 15 degrees of anteversion.  The cup was then placed with  excellent fixation achieved.  The bone quality was very good.  Following this, the attention was then  directed towards the femur.  The femur was reamed and broached after lateralization.  With the size 12 stem in, the hip was reduced with a -2.5 ball and a neutral ball.  The patient had better leg lengths with the -2.5 ball and this was the one that was  chosen.  True components placed on the femur with the head and it was reduced.  Found to be stable with flexion and internal rotation as well as with external rotation to 60 degrees as well as hip extension to 50 degrees.  This was the ball that was  chosen.  Thorough irrigation was then performed and the capsulotomy was closed using #1 Vicryl suture.  The fascia lata was closed using #1 Vicryl suture followed by interrupted inverted 0 Vicryl suture, 2-0 Vicryl suture and a 3-0 Monocryl.  The patient  tolerated the procedure well without immediate complications, transferred to the recovery room in stable condition.  Aquacel dressing placed.  Leg lengths approximately equal at the conclusion of the case.  TN/NUANCE  D:09/04/2018 T:09/04/2018 JOB:[id number]

## 2018-09-04 NOTE — Evaluation (Signed)
Physical Therapy Evaluation Patient Details Name: Charles Benson MRN: [id number] DOB: [birth year] Today's Date: 09/04/2018   History of Present Illness  Pt is a 58 y/o male s/p elective L THA, direct anterior. PMH includes HTN.   Clinical Impression  Pt is s/p surgery above with deficits below. Session limited secondary to increased lightheadedness with mobility. Was able to complete stand pivot transfer using RW with min guard A. Anticipate pt will progress well once feeling better. Will continue to follow acutely to maximize functional mobility independence and safety.     Follow Up Recommendations Follow surgeon's recommendation for DC plan and follow-up therapies;Supervision for mobility/OOB    Equipment Recommendations  Rolling walker with 5" wheels;3in1 (PT)    Recommendations for Other Services       Precautions / Restrictions Precautions Precautions: None Restrictions Weight Bearing Restrictions: Yes LLE Weight Bearing: Weight bearing as tolerated      Mobility  Bed Mobility Overal bed mobility: Needs Assistance Bed Mobility: Supine to Sit     Supine to sit: Min assist     General bed mobility comments: Min A for LLE assist. Increased time required to come to EOB.   Transfers Overall transfer level: Needs assistance Equipment used: Rolling walker (2 wheeled) Transfers: Sit to/from [facility] Sit to Stand: Min guard Stand pivot transfers: Min guard       General transfer comment: Min guard A to stand X2. Pt initially very lightheaded upon first stand and required seated rest. Was able to perform stand pivot following second stand with min guard A. Continued to complain of some light headedness, but reports it improved with seated rest.   Ambulation/Gait                Stairs            Wheelchair Mobility    Modified Rankin (Stroke Patients Only)       Balance Overall balance assessment: Needs assistance Sitting-balance  support: No upper extremity supported;Feet supported Sitting balance-Leahy Scale: Good     Standing balance support: Bilateral upper extremity supported;During functional activity Standing balance-Leahy Scale: Poor Standing balance comment: Reliant on BUE support                              Pertinent Vitals/Pain Pain Assessment: 0-10 Pain Score: 5  Pain Location: L hip  Pain Descriptors / Indicators: Aching;Operative site guarding Pain Intervention(s): Limited activity within patient's tolerance;Monitored during session;Repositioned    Home Living Family/patient expects to be discharged to:: Private residence Living Arrangements: Alone Available Help at Discharge: Friend(s);Available PRN/intermittently Type of Home: Apartment Home Access: Stairs to enter Entrance Stairs-Rails: Left Entrance Stairs-Number of Steps: flight Home Layout: One level Home Equipment: Crutches      Prior Function Level of Independence: Independent               Hand Dominance        Extremity/Trunk Assessment   Upper Extremity Assessment Upper Extremity Assessment: Overall WFL for tasks assessed    Lower Extremity Assessment Lower Extremity Assessment: LLE deficits/detail LLE Deficits / Details: Deficits consistent with post op pain and weakness.    Cervical / Trunk Assessment Cervical / Trunk Assessment: Normal  Communication   Communication: No difficulties  Cognition Arousal/Alertness: Awake/alert Behavior During Therapy: WFL for tasks assessed/performed Overall Cognitive Status: Within Functional Limits for tasks assessed  General Comments      Exercises Total Joint Exercises Ankle Circles/Pumps: AROM;Both;10 reps;Supine   Assessment/Plan    PT Assessment Patient needs continued PT services  PT Problem List Decreased strength;Decreased balance;Decreased activity tolerance;Decreased mobility;Decreased  knowledge of use of DME;Decreased knowledge of precautions;Pain       PT Treatment Interventions DME instruction;Gait training;Stair training;Functional mobility training;Therapeutic activities;Therapeutic exercise;Balance training;Patient/family education    PT Goals (Current goals can be found in the Care Plan section)  Acute Rehab PT Goals Patient Stated Goal: to go home  PT Goal Formulation: With patient Time For Goal Achievement: 09/18/18 Potential to Achieve Goals: Good    Frequency 7X/week   Barriers to discharge Decreased caregiver support      Co-evaluation               AM-PAC PT "6 Clicks" Mobility  Outcome Measure Help needed turning from your back to your side while in a flat bed without using bedrails?: A Little Help needed moving from lying on your back to sitting on the side of a flat bed without using bedrails?: A Little Help needed moving to and from a bed to a chair (including a wheelchair)?: A Little Help needed standing up from a chair using your arms (e.g., wheelchair or bedside chair)?: A Little Help needed to walk in hospital room?: A Lot Help needed climbing 3-5 steps with a railing? : A Lot 6 Click Score: 16    End of Session Equipment Utilized During Treatment: Gait belt Activity Tolerance: Treatment limited secondary to medical complications (Comment)(lightheadedness ) Patient left: in chair;with call bell/phone within reach Nurse Communication: Mobility status;Patient requests pain meds PT Visit Diagnosis: Other abnormalities of gait and mobility (R26.89);Pain Pain - Right/Left: Left Pain - part of body: Hip    Time: 1736-1759 PT Time Calculation (min) (ACUTE ONLY): 23 min   Charges:   PT Evaluation $PT Eval Low Complexity: 1 Low PT Treatments $Therapeutic Activity: 8-22 mins        Gladys DammeBrittany Currie Dennin, PT, DPT  Acute Rehabilitation Services  Pager: [phone] Office: [phone]   Lehman PromBrittany S Shawna Kiener 09/04/2018,  6:25 PM

## 2018-09-04 NOTE — Progress Notes (Signed)
Orthopedic Tech Progress Note Patient Details:  Charles Benson Aug 12, 1960 [id number]  Patient ID: Charles Benson, male   DOB: [birth year], 58 y.o.   MRN: [id number] [facility]  Karolee Stamps 09/04/2018, 8:43 PM

## 2018-09-04 NOTE — Progress Notes (Signed)
Pt stable Just got to floor llequal df ok left Plan PT today possible dc am vs thursday

## 2018-09-04 NOTE — Plan of Care (Signed)

## 2018-09-04 NOTE — Transfer of Care (Signed)
Immediate Anesthesia Transfer of Care Note  Patient: Charles Benson  Procedure(s) Performed: LEFT TOTAL HIP ARTHROPLASTY ANTERIOR APPROACH (Left )  Patient Location: PACU  Anesthesia Type:Spinal  Level of Consciousness: awake  Airway & Oxygen Therapy: Patient Spontanous Breathing  Post-op Assessment: Report given to RN and Post -op Vital signs reviewed and stable  Post vital signs: Reviewed and stable  Last Vitals:  Vitals Value Taken Time  BP 117/83 09/04/18 1417  Temp    Pulse 65 09/04/18 1419  Resp 18 09/04/18 1419  SpO2 99 % 09/04/18 1419  Vitals shown include unvalidated device data.  Last Pain:  Vitals:   09/04/18 0924  TempSrc:   PainSc: 0-No pain         Complications: No apparent anesthesia complications

## 2018-09-04 NOTE — Anesthesia Postprocedure Evaluation (Signed)
Anesthesia Post Note  Patient: Charles Benson  Procedure(s) Performed: LEFT TOTAL HIP ARTHROPLASTY ANTERIOR APPROACH (Left )     Patient location during evaluation: PACU Anesthesia Type: Spinal Level of consciousness: oriented and awake and alert Pain management: pain level controlled Vital Signs Assessment: post-procedure vital signs reviewed and stable Respiratory status: spontaneous breathing, respiratory function stable and patient connected to nasal cannula oxygen Cardiovascular status: blood pressure returned to baseline and stable Postop Assessment: no headache, no backache and no apparent nausea or vomiting Anesthetic complications: no    Last Vitals:  Vitals:   09/04/18 1547 09/04/18 1603  BP: 122/83 (!) 110/58  Pulse: 69 71  Resp: 16 16  Temp:  (!) 36.4 C  SpO2: 99% 97%    Last Pain:  Vitals:   09/04/18 1603  TempSrc: Oral  PainSc:                  Charles Benson

## 2018-09-04 NOTE — Anesthesia Procedure Notes (Signed)
Spinal  Patient location during procedure: OR Start time: 09/04/2018 10:57 AM End time: 09/04/2018 11:00 AM Staffing Anesthesiologist: Lillia Abed, MD Performed: anesthesiologist  Preanesthetic Checklist Completed: patient identified, surgical consent, pre-op evaluation, timeout performed, IV checked, risks and benefits discussed and monitors and equipment checked Spinal Block Patient position: sitting Prep: DuraPrep Patient monitoring: blood pressure, continuous pulse ox, cardiac monitor and heart rate Approach: right paramedian Location: L3-4 Injection technique: single-shot Needle Needle type: Pencan

## 2018-09-04 NOTE — H&P (Signed)
TOTAL HIP ADMISSION H&P  Patient is admitted for left total hip arthroplasty.  Subjective:  Chief Complaint: left hip pain  HPI: Charles Benson, 58 y.o. male, has a history of pain and functional disability in the left hip(s) due to trauma and patient has failed non-surgical conservative treatments for greater than 12 weeks to include NSAID's and/or analgesics, corticosteriod injections and activity modification.  Onset of symptoms was abrupt starting 1 years ago with rapidlly worsening course since that time.The patient noted no past surgery on the left hip(s).  Patient currently rates pain in the left hip at 8 out of 10 with activity. Patient has night pain, worsening of pain with activity and weight bearing, trendelenberg gait, pain that interfers with activities of daily living and pain with passive range of motion. Patient has evidence of subchondral sclerosis and joint space narrowing by imaging studies. This condition presents safety issues increasing the risk of falls. This patient has had Traumatic injury when he was working at [employer].  Has significant left hip injury and by MRI scanning has developed progressive severe arthritis in that left hip since that time.  He did have good relief from intra-articular cortisone injection but it was short-lived.  He denies any back pain..  There is no current active infection.  Patient Active Problem List   Diagnosis Date Noted  . Essential hypertension 08/31/2018  . 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 Arthritis   . Hypertension     Past Surgical History:  Procedure Laterality Date  . ANTERIOR CERVICAL DECOMP/DISCECTOMY FUSION Bilateral 10/10/2014   Procedure: ANTERIOR CERVICAL DECOMPRESSION/DISCECTOMY CERVICAL FIVE-SIX;  Surgeon: Jovita Gamma, MD;  Location: [place] [facility];  Service: Neurosurgery;  Laterality: Bilateral;  . TONSILLECTOMY    . WRIST SURGERY      Current  Facility-Administered Medications  Medication Dose Route Frequency Provider Last Rate Last Dose  . ceFAZolin (ANCEF) 2-4 GM/100ML-% IVPB           . ceFAZolin (ANCEF) IVPB 2g/100 mL premix  2 g Intravenous On Call to OR Meredith Pel, MD      . chlorhexidine (HIBICLENS) 4 % liquid 4 application  60 mL Topical Once Meredith Pel, MD      . chlorhexidine (HIBICLENS) 4 % liquid 4 application  60 mL Topical Once Meredith Pel, MD      . lactated ringers infusion   Intravenous Continuous Lillia Abed, MD 10 mL/hr at 09/04/18 [phone]    . tranexamic acid (CYKLOKAPRON) 1000MG /165mL IVPB           . tranexamic acid (CYKLOKAPRON) IVPB 1,000 mg  1,000 mg Intravenous To OR Marlou Sa, Tonna Corner, MD       No Known Allergies  Social History   Tobacco Use  . Smoking status: Never Smoker  . Smokeless tobacco: Never Used  Substance Use Topics  . Alcohol use: Yes    Comment: occ    History reviewed. No pertinent family history.   Review of Systems  Musculoskeletal: Positive for joint pain.  All other systems reviewed and are negative.   Objective:  Physical Exam  Constitutional: He appears well-developed.  HENT:  Head: Normocephalic.  Eyes: Pupils are equal, round, and reactive to light.  Neck: Normal range of motion.  Cardiovascular: Normal rate.  Respiratory: Effort normal.  Neurological: He is alert.  Skin: Skin is warm.  Psychiatric: He has a normal mood and affect.  Examination of the left  hip demonstrates antalgic gait to the left.  Leg lengths approximately equal.  Patient has groin pain and restricted range of motion with range of motion of that left hip.  Hip flexion strength intact.  No masses lymphadenopathy or skin changes noted in that left hip region  Vital signs in last 24 hours: Temp:  [98.7 F (37.1 C)] 98.7 F (37.1 C) (06/30 0902) Pulse Rate:  [82] 82 (06/30 0902) Resp:  [18] 18 (06/30 0902) BP: (138-181)/(87-103) 138/87 (06/30 1010) SpO2:  [97 %] 97 %  (06/30 0902) Weight:  [102.5 kg] 102.5 kg (06/30 0924)  Labs:   Estimated body mass index is 29.82 kg/m as calculated from the following:   Height as of this encounter: 6\' 1"  (1.854 m).   Weight as of this encounter: 102.5 kg.   Imaging Review Plain radiographs demonstrate moderate degenerative joint disease of the left hip(s). The bone quality appears to be good for age and reported activity level.      Assessment/Plan:  End stage arthritis, left hip(s)  The patient history, physical examination, clinical judgement of the provider and imaging studies are consistent with end stage degenerative joint disease of the left hip(s) and total hip arthroplasty is deemed medically necessary. The treatment options including medical management, injection therapy, arthroscopy and arthroplasty were discussed at length. The risks and benefits of total hip arthroplasty were presented and reviewed. The risks due to aseptic loosening, infection, stiffness, dislocation/subluxation,  thromboembolic complications and other imponderables were discussed.  The patient acknowledged the explanation, agreed to proceed with the plan and consent was signed. Patient is being admitted for inpatient treatment for surgery, pain control, PT, OT, prophylactic antibiotics, VTE prophylaxis, progressive ambulation and ADL's and discharge planning.The patient is planning to be discharged home with home health services    Patient's anticipated LOS is less than 2 midnights, meeting these requirements: - Younger than 3165 - Lives within 1 hour of care - Has a competent adult at home to recover with post-op recover - NO history of  - Chronic pain requiring opiods  - Diabetes  - Coronary Artery Disease  - Heart failure  - Heart attack  - Stroke  - DVT/VTE  - Cardiac arrhythmia  - Respiratory Failure/COPD  - Renal failure  - Anemia  - Advanced Liver disease

## 2018-09-04 NOTE — Brief Op Note (Signed)
   09/04/2018  3:18 PM  PATIENT:  Charles Benson  58 y.o. male  PRE-OPERATIVE DIAGNOSIS:  left hip osteoarthritis  POST-OPERATIVE DIAGNOSIS:  left hip osteoarthritis  PROCEDURE:  Procedure(s): LEFT TOTAL HIP ARTHROPLASTY ANTERIOR APPROACH  SURGEON:  Surgeon(s): Meredith Pel, MD  ASSISTANT: magnant pa  ANESTHESIA:   spinal  EBL: 200 ml    Total I/O In: 800 [I.V.:800] Out: 350 [Blood:350]  BLOOD ADMINISTERED: none  DRAINS: none   LOCAL MEDICATIONS USED:  none  SPECIMEN:  No Specimen  COUNTS:  YES  TOURNIQUET:  * No tourniquets in log *  DICTATION: .Other Dictation: Dictation Number [id number]  PLAN OF CARE: Admit for overnight observation  PATIENT DISPOSITION:  PACU - hemodynamically stable

## 2018-09-05 ENCOUNTER — Encounter (HOSPITAL_COMMUNITY): Payer: Self-pay | Admitting: Orthopedic Surgery

## 2018-09-05 ENCOUNTER — Telehealth: Payer: Self-pay | Admitting: Orthopedic Surgery

## 2018-09-05 ENCOUNTER — Telehealth: Payer: Self-pay

## 2018-09-05 ENCOUNTER — Other Ambulatory Visit: Payer: Self-pay

## 2018-09-05 DIAGNOSIS — M25552 Pain in left hip: Secondary | ICD-10-CM | POA: Diagnosis present

## 2018-09-05 DIAGNOSIS — M1612 Unilateral primary osteoarthritis, left hip: Secondary | ICD-10-CM | POA: Diagnosis present

## 2018-09-05 DIAGNOSIS — I10 Essential (primary) hypertension: Secondary | ICD-10-CM | POA: Diagnosis present

## 2018-09-05 DIAGNOSIS — Z9181 History of falling: Secondary | ICD-10-CM | POA: Diagnosis not present

## 2018-09-05 NOTE — Telephone Encounter (Signed)
Faxed the 09/04/18 op note to the case mgr per her request

## 2018-09-05 NOTE — Plan of Care (Signed)
  Problem: Safety: Goal: Ability to remain free from injury will improve Outcome: Progressing   Problem: Pain Managment: Goal: General experience of comfort will improve Outcome: Progressing   

## 2018-09-05 NOTE — Progress Notes (Signed)
Pt is contacting family and friends. Stated that we do not need to update anyone

## 2018-09-05 NOTE — Progress Notes (Signed)
Physical Therapy Treatment Patient Details Name: Charles Benson MRN: [id number] DOB: 01/17/1961 Today's Date: 09/05/2018    History of Present Illness Pt is a 58 y/o male s/p elective L THA, direct anterior. PMH includes HTN.     PT Comments    Patient progressing with HEP instruction and able to negotiate stairs appropriate for home entry as well as get himself in and out of bed on the side he gets in at home.  Concerned he will have to step over tub to shower and needs assist to get to his feet.  Ordered OT per protocol.  PT to follow up prior to d/c tomorrow.    Follow Up Recommendations  Home health PT     Equipment Recommendations  Rolling walker with 5" wheels;3in1 (PT)    Recommendations for Other Services       Precautions / Restrictions Precautions Precautions: Fall Restrictions LLE Weight Bearing: Weight bearing as tolerated    Mobility  Bed Mobility Overal bed mobility: Modified Independent             General bed mobility comments: used hands to help leg off bed and back in bed to side appropriate for bed at home  Transfers Overall transfer level: Needs assistance Equipment used: Rolling walker (2 wheeled) Transfers: Sit to/from Stand Sit to Stand: Supervision         General transfer comment: increased time, hand placement on his own with visual reminder  Ambulation/Gait Ambulation/Gait assistance: Supervision Gait Distance (Feet): 200 Feet Assistive device: Rolling walker (2 wheeled) Gait Pattern/deviations: Step-to pattern;Decreased step length - right;Antalgic     General Gait Details: cues to slow down   Stairs Stairs: Yes Stairs assistance: Supervision Stair Management: Step to pattern;Forwards;Two rails Number of Stairs: 2 General stair comments: to simulate home entry, cues for technique and demonstration   Wheelchair Mobility    Modified Rankin (Stroke Patients Only)       Balance Overall balance assessment: Needs  assistance   Sitting balance-Leahy Scale: Good     Standing balance support: No upper extremity supported Standing balance-Leahy Scale: Fair Standing balance comment: washing hands at sink                            Cognition Arousal/Alertness: Awake/alert Behavior During Therapy: WFL for tasks assessed/performed Overall Cognitive Status: Within Functional Limits for tasks assessed                                        Exercises Total Joint Exercises Ankle Circles/Pumps: AROM;10 reps;Both;Supine Quad Sets: AROM;Left;10 reps;Supine Short Arc Quad: AROM;Left;10 reps;Supine Heel Slides: AAROM;Left;10 reps;Supine Hip ABduction/ADduction: AAROM;Left;10 reps;Supine Long Arc Quad: AROM;5 reps;Left;Seated    General Comments General comments (skin integrity, edema, etc.): issued written HEP and discussed HHPT      Pertinent Vitals/Pain Pain Score: 8  Pain Location: L hip  Pain Descriptors / Indicators: Discomfort;Tightness Pain Intervention(s): Monitored during session;Repositioned;RN gave pain meds during session;Ice applied    Home Living Family/patient expects to be discharged to:: Private residence Living Arrangements: Alone                  Prior Function            PT Goals (current goals can now be found in the care plan section) Progress towards PT goals: Progressing toward goals  Frequency    7X/week      PT Plan Current plan remains appropriate    Co-evaluation              AM-PAC PT "6 Clicks" Mobility   Outcome Measure  Help needed turning from your back to your side while in a flat bed without using bedrails?: None Help needed moving from lying on your back to sitting on the side of a flat bed without using bedrails?: None Help needed moving to and from a bed to a chair (including a wheelchair)?: A Little Help needed standing up from a chair using your arms (e.g., wheelchair or bedside chair)?: A  Little Help needed to walk in hospital room?: A Little Help needed climbing 3-5 steps with a railing? : A Little 6 Click Score: 20    End of Session Equipment Utilized During Treatment: Gait belt Activity Tolerance: Patient tolerated treatment well Patient left: with call bell/phone within reach;in bed   PT Visit Diagnosis: Other abnormalities of gait and mobility (R26.89);Pain Pain - Right/Left: Left Pain - part of body: Hip     Time: [id number] PT Time Calculation (min) (ACUTE ONLY): 41 min  Charges:  $Gait Training: 23-37 mins $Therapeutic Exercise: 8-22 mins                     Charles LawlessCyndi Selah Benson, South CarolinaPT Acute Rehabilitation Services [phone] 09/05/2018    Charles Benson 09/05/2018, 4:44 PM

## 2018-09-05 NOTE — Plan of Care (Signed)

## 2018-09-05 NOTE — Progress Notes (Signed)
  Subjective: Pt is a 58 y.o. Male s/p Lt THA. He is POD1.  Pt states that his pain was severe following the procedure but has since improved since yesterday evening. He describes his pain as a "soreness" now.  He denies any numbness/tingling/weakness.  He has ambulated once to his chair in his room, without any incidents, and he is able to sit in the chair and on the edge of his bed with relief from pain.     Objective: Vital signs in last 24 hours: Temp:  [97 F (36.1 C)-98.7 F (37.1 C)] 98.5 F (36.9 C) (07/01 0502) Pulse Rate:  [55-82] 72 (07/01 0502) Resp:  [12-18] 14 (07/01 0502) BP: (83-181)/(58-103) 120/81 (07/01 0502) SpO2:  [96 %-99 %] 97 % (07/01 0502) Weight:  [102.5 kg] 102.5 kg (06/30 0924)  Intake/Output from previous day: 06/30 0701 - 07/01 0700 In: 1380 [P.O.:480; I.V.:800; IV Piggyback:100] Out: 2800 [Urine:2450; Blood:350] Intake/Output this shift: No intake/output data recorded.  Exam:  Aquacel dressing intact without gross blood or discharge Plantar flexion/dorsiflexion intact on LLE 2+ PT and DP pulses on LLE  Labs: No results for input(s): HGB in the last 72 hours. No results for input(s): WBC, RBC, HCT, PLT in the last 72 hours. No results for input(s): NA, K, CL, CO2, BUN, CREATININE, GLUCOSE, CALCIUM in the last 72 hours. No results for input(s): LABPT, INR in the last 72 hours.  Assessment/Plan: Pt is a 58 y.o. male who is POD1 s/p Lt THA.    -PT to eval later today  -Pt will go home tomorrow in order to have an extra night of recovery and care, due to   having no assistance at his home  -F/u with Dr. Marlou Sa in clinic in 2 weeks   Donella Stade 09/05/2018, 7:49 AM

## 2018-09-05 NOTE — Progress Notes (Signed)
Physical Therapy Treatment Patient Details Name: Charles LoutWilliam Maisel MRN: [id number] DOB: 07-26-1960 Today's Date: 09/05/2018    History of Present Illness Pt is a 58 y/o male s/p elective L THA, direct anterior. PMH includes HTN.     PT Comments    Patient progressing with hallway ambulation and able to demonstrate improved balance with cues for walker positioning.  Will plan to practice steps later today and issue handout for HEP.  Still feel he may benefit from HHPT at least for safety eval since living alone.  Will follow.    Follow Up Recommendations  Home health PT     Equipment Recommendations  Rolling walker with 5" wheels;3in1 (PT)    Recommendations for Other Services       Precautions / Restrictions Precautions Precautions: Fall    Mobility  Bed Mobility               General bed mobility comments: seated EOB upon my entry  Transfers Overall transfer level: Needs assistance Equipment used: Rolling walker (2 wheeled) Transfers: Sit to/from Stand Sit to Stand: Min guard         General transfer comment: assist for balance, cues for hand placement, up from low end of the bed  Ambulation/Gait Ambulation/Gait assistance: Min guard;Supervision Gait Distance (Feet): 150 Feet Assistive device: Rolling walker (2 wheeled) Gait Pattern/deviations: Step-to pattern;Decreased step length - right     General Gait Details: cues for positioning in walker for safety.   Stairs             Wheelchair Mobility    Modified Rankin (Stroke Patients Only)       Balance Overall balance assessment: Needs assistance Sitting-balance support: No upper extremity supported Sitting balance-Leahy Scale: Good     Standing balance support: Single extremity supported Standing balance-Leahy Scale: Poor Standing balance comment: mild posterior bias initially                            Cognition Arousal/Alertness: Awake/alert Behavior During Therapy: WFL  for tasks assessed/performed Overall Cognitive Status: Within Functional Limits for tasks assessed                                        Exercises Total Joint Exercises Hip ABduction/ADduction: AROM;5 reps;Standing;Left Knee Flexion: AROM;5 reps;Standing;Left Marching in Standing: AROM;5 reps;Standing;Left Standing Hip Extension: AROM;5 reps;Standing;Left    General Comments General comments (skin integrity, edema, etc.): educated on progression of activity and plan to practice steps this pm      Pertinent Vitals/Pain Pain Location: L hip  Pain Descriptors / Indicators: Discomfort;Tightness Pain Intervention(s): Monitored during session;Repositioned;Premedicated before session;Ice applied    Home Living                      Prior Function            PT Goals (current goals can now be found in the care plan section) Progress towards PT goals: Progressing toward goals    Frequency           PT Plan Current plan remains appropriate    Co-evaluation              AM-PAC PT "6 Clicks" Mobility   Outcome Measure  Help needed turning from your back to your side while in a flat bed without using bedrails?: A  Little Help needed moving from lying on your back to sitting on the side of a flat bed without using bedrails?: A Little Help needed moving to and from a bed to a chair (including a wheelchair)?: A Little Help needed standing up from a chair using your arms (e.g., wheelchair or bedside chair)?: A Little Help needed to walk in hospital room?: A Little Help needed climbing 3-5 steps with a railing? : A Little 6 Click Score: 18    End of Session Equipment Utilized During Treatment: Gait belt Activity Tolerance: Patient tolerated treatment well Patient left: in chair;with call bell/phone within reach   PT Visit Diagnosis: Other abnormalities of gait and mobility (R26.89);Pain Pain - Right/Left: Left Pain - part of body: Hip      Time: 4782-9562 PT Time Calculation (min) (ACUTE ONLY): 27 min  Charges:  $Gait Training: 8-22 mins $Therapeutic Exercise: 8-22 mins                     Magda Kiel, [place] [facility] [phone] 09/05/2018    Reginia Naas 09/05/2018, 10:52 AM

## 2018-09-05 NOTE — Telephone Encounter (Signed)
Received a call from [facility] [place] checking status of opinion letter sent in June. I advised her I recall receiving as well as previous conversation. That likely, it has not been addressed by Dr. Marlou Sa. She is going to email letter to me to ensure our receipt, I reiterated that upon Dr. Forbes Cellar attention to it, I will return it to her.ph [phone]

## 2018-09-05 NOTE — Progress Notes (Signed)
CSW acknowledges SNF consult, notes rec for [facility] PT, patient will dc home tomorrow. No further SW needs identified.   CSW signing off.   [place], [place]

## 2018-09-05 NOTE — Progress Notes (Signed)
Pt doing well so far amb in room Pain ok Plan dc to home pm tomorrow with hhpt

## 2018-09-05 NOTE — Plan of Care (Signed)
  Problem: Pain Managment: Goal: General experience of comfort will improve Outcome: Progressing   Problem: Activity: Goal: Risk for activity intolerance will decrease Outcome: Progressing   Problem: Education: Goal: Knowledge of General Education information will improve Description: Including pain rating scale, medication(s)/side effects and non-pharmacologic comfort measures Outcome: Progressing   

## 2018-09-05 NOTE — TOC Initial Note (Signed)
Transition of Care [facility]) - Initial/Assessment Note    Patient Details  Name: Charles Benson MRN: [id number] Date of Birth: 08-14-1960  Transition of Care [facility]) CM/SW Contact:    Bartholomew Crews, RN Phone Number: [phone] 09/05/2018, 5:30 PM  Clinical Narrative:                 Spoke with patient at bedside. Discussed arrangements being made for [facility] - provider has not been found at this time. Tillie Rung at [employer] is working on [employer] for PT/OT.  Received call from Danae Chen [phone]) at [employer] looking for assist with DME delivery. Spoke with Thedore Mins at [facility] to provide payor information. RW and 3N1 to be delivered to room prior to discharge.   Patient reports that he lives alone, but has people he can call in an emergency. He does have a friend who is available to transport home.   CM to follow for transition of care needs.   Expected Discharge Plan: [place] Barriers to Discharge: Continued Medical Work up   Patient Goals and CMS Choice Patient states their goals for this hospitalization and ongoing recovery are:: states he is making progress daily CMS Medicare.gov Compare Post Acute Care list provided to:: Patient Choice offered to / list presented to : Patient  Expected Discharge Plan and Services Expected Discharge Plan: [place]   Discharge Planning Services: CM Consult Post Acute Care Choice: Durable Medical Equipment, Home Health Living arrangements for the past 2 months: Single Family Home Expected Discharge Date: 09/07/18               DME Arranged: 3-N-1, Gilford Rile rolling DME Agency: [employer] Date DME Agency Contacted: 09/05/18 Time DME Agency Contacted: 55 Representative spoke with at DME Agency: [place] Arranged: PT, OT          Prior Living Arrangements/Services Living arrangements for the past 2 months: [place] with:: Self   Do you feel safe going back to the place where you  live?: Yes               Activities of Daily Living Home Assistive Devices/Equipment: None ADL Screening (condition at time of admission) Patient's cognitive ability adequate to safely complete daily activities?: Yes Is the patient deaf or have difficulty hearing?: No Does the patient have difficulty seeing, even when wearing glasses/contacts?: No Does the patient have difficulty concentrating, remembering, or making decisions?: No Patient able to express need for assistance with ADLs?: Yes Does the patient have difficulty dressing or bathing?: No Independently performs ADLs?: Yes (appropriate for developmental age) Does the patient have difficulty walking or climbing stairs?: Yes Weakness of Legs: Left Weakness of Arms/Hands: None  Permission Sought/Granted                  Emotional Assessment Appearance:: Appears stated age Attitude/Demeanor/Rapport: Engaged Affect (typically observed): Accepting Orientation: : Oriented to Self, Oriented to Place, Oriented to  Time, Oriented to Situation      Admission diagnosis:  left hip osteoarthritis Patient Active Problem List   Diagnosis Date Noted  . Hip arthritis 09/04/2018  . Essential hypertension 08/31/2018  . Cervical spine instability 10/10/2014   PCP:  Patient, No Pcp Per Pharmacy:   [facility], [place] [address] [place] [address] Phone: [phone] Fax: [phone]     Social Determinants of Health (SDOH) Interventions    Readmission  Risk Interventions No flowsheet data found.

## 2018-09-06 ENCOUNTER — Other Ambulatory Visit: Payer: Self-pay

## 2018-09-06 ENCOUNTER — Other Ambulatory Visit: Payer: Self-pay | Admitting: Surgical

## 2018-09-06 MED ORDER — METHOCARBAMOL 500 MG PO TABS: 500.0000 mg | ORAL_TABLET | Freq: Three times a day (TID) | ORAL | 0 refills | Status: AC | PRN

## 2018-09-06 MED ORDER — CELECOXIB 200 MG PO CAPS: 200.0000 mg | ORAL_CAPSULE | Freq: Every day | ORAL | 2 refills | Status: DC

## 2018-09-06 MED ORDER — NABUMETONE 500 MG PO TABS: 500.0000 mg | ORAL_TABLET | Freq: Two times a day (BID) | ORAL | 0 refills | Status: AC

## 2018-09-06 MED ORDER — ASPIRIN 81 MG PO CHEW: 81.0000 mg | CHEWABLE_TABLET | Freq: Two times a day (BID) | ORAL | 0 refills | Status: AC

## 2018-09-06 MED ORDER — OXYCODONE HCL 5 MG PO TABS: 5.0000 mg | ORAL_TABLET | Freq: Four times a day (QID) | ORAL | 0 refills | Status: DC | PRN

## 2018-09-06 NOTE — Progress Notes (Signed)
  Subjective: Pt is a 58 y.o. Male who is POD2 s/p Lt THA.  He states that today he is doing very well.  He has been doing exercises assigned by PT.  Pt has been ambulating with a walker without difficulty.  Denies numbness/tingling/weakness/dizziness.     Objective: Vital signs in last 24 hours: Temp:  [98.7 F (37.1 C)-99 F (37.2 C)] 98.7 F (37.1 C) (07/02 0522) Pulse Rate:  [77-83] 77 (07/02 0522) Resp:  [12-14] 12 (07/02 0522) BP: (126-141)/(83-88) 126/83 (07/02 0522) SpO2:  [94 %-95 %] 95 % (07/02 0522)  Intake/Output from previous day: 07/01 0701 - 07/02 0700 In: 720 [P.O.:720] Out: 1850 [Urine:1850] Intake/Output this shift: No intake/output data recorded.  Exam:  2+ DP Pulse on LLE Dorsiflexion/plantar flexion intact on LLE  Labs: No results for input(s): HGB in the last 72 hours. No results for input(s): WBC, RBC, HCT, PLT in the last 72 hours. No results for input(s): NA, K, CL, CO2, BUN, CREATININE, GLUCOSE, CALCIUM in the last 72 hours. No results for input(s): LABPT, INR in the last 72 hours.  Assessment/Plan: Pt is a 58 y.o. Male who is POD2 s/p Lt THA.  -Plan to discharge today after one session of PT and after patient receives his   equipment for home therapy and ambulation.  Pt is agreeable to plan  -F/u in 2 weeks with Dr. Marlou Sa in clinic   [place] 09/06/2018, 7:56 AM

## 2018-09-06 NOTE — Telephone Encounter (Signed)
Please advise, thank you.

## 2018-09-06 NOTE — Progress Notes (Signed)
Occupational Therapy Evaluation Patient Details Name: Charles Benson MRN: [id number] DOB: September 26, 1960 Today's Date: 09/06/2018    History of Present Illness Pt is a 58 y/o male s/p elective L THA, direct anterior. PMH includes HTN.    Clinical Impression   Pt presents with above diagnosis. PTA pt PLOF independent in all ADLs and IADLs while living at home alone. Pt currently requires assistance with functional transfers and LB ADLs due to pain and limited function. Pt educated on 3n1 tub transfer with demonstration and teach back, no VCs required. Pt educated AE with visual instruction on how to use reacher and sock aid to don/doff sock. Pt will benefit from continued HHOT to address deficits and to ensure safe engagement of ADLs in home environment. Social worker informed of 3n1 and hip kit. No further acute OT required. OT will sign off.     Follow Up Recommendations  Home health OT    Equipment Recommendations  3 in 1 bedside commode    Recommendations for Other Services       Precautions / Restrictions Precautions Precautions: Fall Restrictions Weight Bearing Restrictions: Yes LLE Weight Bearing: Weight bearing as tolerated      Mobility Bed Mobility Overal bed mobility: Modified Independent Bed Mobility: Supine to Sit     Supine to sit: Modified independent (Device/Increase time)     General bed mobility comments: used hands to help leg off bed and back in bed to side appropriate for bed at home  Transfers Overall transfer level: Needs assistance Equipment used: Rolling walker (2 wheeled) Transfers: Sit to/from Stand Sit to Stand: Supervision         General transfer comment: increased time, VC for hand placement for stand to sit transition    Balance Overall balance assessment: Needs assistance Sitting-balance support: No upper extremity supported Sitting balance-Leahy Scale: Good     Standing balance support: No upper extremity supported Standing  balance-Leahy Scale: Fair Standing balance comment: washing hands at sink and toileting                           ADL either performed or assessed with clinical judgement   ADL Overall ADL's : Needs assistance/impaired Eating/Feeding: Independent;Sitting   Grooming: Wash/dry hands;Wash/dry face;Oral care;Standing;Independent   Upper Body Bathing: Independent;Sitting   Lower Body Bathing: Modified independent;Sitting/lateral leans   Upper Body Dressing : Independent;Sitting   Lower Body Dressing: Modified independent;Sitting/lateral leans   Toilet Transfer: Modified Independent;Ambulation;Regular Toilet           Functional mobility during ADLs: Supervision/safety General ADL Comments: Pt educated of tub transfer and AE with handout provided with demonstration and teach back.     Vision         Perception     Praxis      Pertinent Vitals/Pain Pain Assessment: Faces Faces Pain Scale: Hurts a little bit Pain Location: L hip  Pain Descriptors / Indicators: Discomfort;Tightness Pain Intervention(s): Monitored during session;Repositioned     Hand Dominance     Extremity/Trunk Assessment Upper Extremity Assessment Upper Extremity Assessment: Overall WFL for tasks assessed   Lower Extremity Assessment Lower Extremity Assessment: Defer to PT evaluation LLE Deficits / Details: Deficits consistent with post op pain and weakness.   Cervical / Trunk Assessment Cervical / Trunk Assessment: Normal   Communication Communication Communication: No difficulties   Cognition Arousal/Alertness: Awake/alert Behavior During Therapy: WFL for tasks assessed/performed Overall Cognitive Status: Within Functional Limits for tasks assessed  General Comments  handout for 3n1 tub transfer    Exercises     Shoulder Instructions      Home Living Family/patient expects to be discharged to:: Private residence Living  Arrangements: Alone Available Help at Discharge: Friend(s);Available PRN/intermittently Type of Home: Apartment Home Access: Stairs to enter Entrance Stairs-Number of Steps: flight Entrance Stairs-Rails: Left Home Layout: One level     Bathroom Shower/Tub: Teacher, early years/pre: Standard     Home Equipment: Crutches          Prior Functioning/Environment Level of Independence: Independent                 OT Problem List: Impaired balance (sitting and/or standing);Pain;Decreased safety awareness;Decreased knowledge of use of DME or AE;Decreased knowledge of precautions      OT Treatment/Interventions:      OT Goals(Current goals can be found in the care plan section) Acute Rehab OT Goals Patient Stated Goal: to go home  OT Goal Formulation: With patient Time For Goal Achievement: 09/20/18 Potential to Achieve Goals: Good  OT Frequency:     Barriers to D/C:            Co-evaluation              AM-PAC OT "6 Clicks" Daily Activity     Outcome Measure Help from another person eating meals?: None Help from another person taking care of personal grooming?: None Help from another person toileting, which includes using toliet, bedpan, or urinal?: None Help from another person bathing (including washing, rinsing, drying)?: None Help from another person to put on and taking off regular upper body clothing?: None Help from another person to put on and taking off regular lower body clothing?: None 6 Click Score: 24   End of Session Equipment Utilized During Treatment: Gait belt;Rolling walker Nurse Communication: Mobility status;Weight bearing status  Activity Tolerance: Patient tolerated treatment well Patient left: in bed;with call bell/phone within reach  OT Visit Diagnosis: Unsteadiness on feet (R26.81);Pain Pain - Right/Left: Left Pain - part of body: Hip                Time: 1093-2355 OT Time Calculation (min): 47 min Charges:  OT  General Charges $OT Visit: 1 Visit OT Evaluation $OT Eval Low Complexity: 1 Low OT Treatments $Self Care/Home Management : 23-37 mins  Minus Breeding, MSOT, OTR/L  Supplemental Rehabilitation Services  [phone]   Marius Ditch 09/06/2018, 10:24 AM

## 2018-09-06 NOTE — Telephone Encounter (Signed)
Relafen 500 po bid # 60 pls clala thx

## 2018-09-06 NOTE — Progress Notes (Signed)
Physical Therapy Treatment Patient Details Name: Charles Benson MRN: [id number] DOB: 1961-01-24 Today's Date: 09/06/2018    History of Present Illness Pt is a 58 y/o male s/p elective L THA, direct anterior. PMH includes HTN.     PT Comments    Patient seen for continued strength and mobility training. Education on hooking of L LE by R LE for greater ease with bed mobility with good carryover. Session limited to standing ther-ex as patient had independently performed supine exercises as well as mobilizing with OT in hallway. Good tolerance to standing exercise with only complaints of L quad tightness. Making good progress towards all goals.     Follow Up Recommendations  Home health PT     Equipment Recommendations  Rolling walker with 5" wheels;3in1 (PT)    Recommendations for Other Services       Precautions / Restrictions Precautions Precautions: Fall Restrictions Weight Bearing Restrictions: Yes LLE Weight Bearing: Weight bearing as tolerated    Mobility  Bed Mobility Overal bed mobility: Modified Independent Bed Mobility: Supine to Sit     Supine to sit: Modified independent (Device/Increase time)     General bed mobility comments: use of B UE to guide L LE to EOB - PT educating on hook technique with R LE with greater ease of transfer  Transfers Overall transfer level: Needs assistance Equipment used: Rolling walker (2 wheeled) Transfers: Sit to/from Stand Sit to Stand: Supervision         General transfer comment: increased time, VC for hand placement for stand to sit transition  Ambulation/Gait             General Gait Details: deffered as patient had just ambulated length of hallway x 2 with OT   Stairs             Wheelchair Mobility    Modified Rankin (Stroke Patients Only)       Balance Overall balance assessment: Needs assistance Sitting-balance support: No upper extremity supported Sitting balance-Leahy Scale: Good      Standing balance support: No upper extremity supported Standing balance-Leahy Scale: Fair Standing balance comment: washing hands at sink and toileting                            Cognition Arousal/Alertness: Awake/alert Behavior During Therapy: WFL for tasks assessed/performed Overall Cognitive Status: Within Functional Limits for tasks assessed                                        Exercises Total Joint Exercises Ankle Circles/Pumps: AROM;Both;10 reps;Standing(B heel raises) Hip ABduction/ADduction: AROM;Left;10 reps;Standing Marching in Standing: AROM;Left;10 reps;Standing Standing Hip Extension: AROM;Left;10 reps;Standing(L HS curls)    General Comments General comments (skin integrity, edema, etc.): handout for 3n1 tub transfer      Pertinent Vitals/Pain Pain Assessment: Faces Faces Pain Scale: Hurts a little bit Pain Location: L hip  Pain Descriptors / Indicators: Discomfort;Tightness Pain Intervention(s): Limited activity within patient's tolerance;Monitored during session;Repositioned;Ice applied;RN gave pain meds during session    [place] expects to be discharged to:: Private residence Living Arrangements: Alone Available Help at Discharge: Friend(s);Available PRN/intermittently Type of Home: Apartment Home Access: Stairs to enter Entrance Stairs-Rails: Left Home Layout: One level Home Equipment: Crutches      Prior Function Level of Independence: Independent  PT Goals (current goals can now be found in the care plan section) Acute Rehab PT Goals Patient Stated Goal: to go home  PT Goal Formulation: With patient Time For Goal Achievement: 09/18/18 Potential to Achieve Goals: Good Progress towards PT goals: Progressing toward goals    Frequency    7X/week      PT Plan Current plan remains appropriate    Co-evaluation              AM-PAC PT "6 Clicks" Mobility   Outcome Measure   Help needed turning from your back to your side while in a flat bed without using bedrails?: None Help needed moving from lying on your back to sitting on the side of a flat bed without using bedrails?: None Help needed moving to and from a bed to a chair (including a wheelchair)?: A Little Help needed standing up from a chair using your arms (e.g., wheelchair or bedside chair)?: A Little Help needed to walk in hospital room?: A Little Help needed climbing 3-5 steps with a railing? : A Little 6 Click Score: 20    End of Session Equipment Utilized During Treatment: Gait belt Activity Tolerance: Patient tolerated treatment well Patient left: with call bell/phone within reach;in bed Nurse Communication: Mobility status PT Visit Diagnosis: Other abnormalities of gait and mobility (R26.89);Pain Pain - Right/Left: Left Pain - part of body: Hip     Time: 1000-1014 PT Time Calculation (min) (ACUTE ONLY): 14 min  Charges:  $Therapeutic Exercise: 8-22 mins                      Kipp LaurenceStephanie R Trevor Wilkie, PT, DPT Supplemental Physical Therapist 09/06/18 10:39 AM Pager: [phone] Office: [phone]

## 2018-09-06 NOTE — Progress Notes (Signed)
Discharge instructions/education addressed; Pt in stable condition; not in distress.Pt to be pick up by family member at [employer].

## 2018-09-06 NOTE — Telephone Encounter (Signed)
Please advise on alternative.  

## 2018-09-06 NOTE — Care Management (Addendum)
12:58 Patient's adjuster, Erica called to confirm patient's additional DME needs. She will see that it is delivered to patient. Erica: [phone] ,ext [phone].   Case manager faxed order for hip kit: long handled sponge, reacher and shoe horn, to patient's worker's comp. [employer], fax#[phone]. CM requested it be sent to patient's home.    Susan Brady, RN BSN Case Manager [phone] 

## 2018-09-07 NOTE — Progress Notes (Signed)
Received voice message from [place] at [place] x [phone] that patient would receive [facility] PT services from [facility] beginning Tuesday 7/7. This was the earliest that any agency could pick him up.   Manya Silvas, RN MSN CCM Transitions of Care [phone]

## 2018-09-11 ENCOUNTER — Telehealth: Payer: Self-pay | Admitting: Orthopedic Surgery

## 2018-09-11 NOTE — Telephone Encounter (Signed)
Ok to rf? 

## 2018-09-11 NOTE — Telephone Encounter (Signed)
Rx refill Oxycodone 5mg   [facility] [place]

## 2018-09-11 NOTE — Telephone Encounter (Signed)
Please advise. Thanks.  

## 2018-09-12 ENCOUNTER — Other Ambulatory Visit: Payer: Self-pay | Admitting: Orthopedic Surgery

## 2018-09-12 MED ORDER — OXYCODONE HCL 5 MG PO TABS: ORAL_TABLET | ORAL | 0 refills | Status: DC

## 2018-09-12 NOTE — Telephone Encounter (Signed)
IC advised sent 

## 2018-09-13 NOTE — Discharge Summary (Addendum)
Physician Discharge Summary      Patient ID: Charles Benson MRN: [id number] DOB/AGE: 06/13/1960 58 y.o.  Admit date: 09/04/2018 Discharge date: 09/13/2018  Admission Diagnoses:  Active Problems:   Hip arthritis   Discharge Diagnoses:  Same  Surgeries: Procedure(s): LEFT TOTAL HIP ARTHROPLASTY ANTERIOR APPROACH on 09/04/2018   Consultants:   Discharged Condition: Stable  Hospital Course: Charles LoutWilliam Deford is an 58 y.o. male who was admitted 09/04/2018 with a chief complaint of L Hip pain, and found to have a diagnosis of L hip arthritis.  They were brought to the operating room on 09/04/2018 and underwent the above named procedures.  Patient tolerated the procedure well.  He mobilized with therapy on postop day #1 and was able to get around the room.  Made better progress on postop day #2 and was ready for discharge.  He will follow-up with me in approximately 10 days.  Sent home on pain medicine and aspirin for DVT prophylaxis.  Antibiotics given:  Anti-infectives (From admission, onward)   Start     Dose/Rate Route Frequency Ordered Stop   09/04/18 1615  ceFAZolin (ANCEF) IVPB 2g/100 mL premix     2 g 200 mL/hr over 30 Minutes Intravenous Every 6 hours 09/04/18 1603 09/04/18 2240   09/04/18 0915  ceFAZolin (ANCEF) IVPB 2g/100 mL premix     2 g 200 mL/hr over 30 Minutes Intravenous On call to O.R. 09/04/18 0910 09/04/18 1055   09/04/18 0912  ceFAZolin (ANCEF) 2-4 GM/100ML-% IVPB    Note to Pharmacy: Ernie [place], [place]   : cabinet override      09/04/18 0912 09/04/18 1055    .  Recent vital signs:  Vitals:   09/06/18 0522 09/06/18 0826  BP: 126/83 129/83  Pulse: 77 73  Resp: 12 16  Temp: 98.7 F (37.1 C) 98.3 F (36.8 C)  SpO2: 95% 96%    Recent laboratory studies:  Results for orders placed or performed during the hospital encounter of 09/01/18  SARS Coronavirus 2 (Performed in [facility])   Specimen: Nasal Swab  Result Value Ref Range   SARS  Coronavirus 2 NEGATIVE NEGATIVE    Discharge Medications:   Allergies as of 09/06/2018   No Known Allergies     Medication List    STOP taking these medications   cyclobenzaprine 10 MG tablet Commonly known as: FLEXERIL   HYDROcodone-acetaminophen 5-325 MG tablet Commonly known as: NORCO/VICODIN   nabumetone 500 MG tablet Commonly known as: RELAFEN   oxyCODONE-acetaminophen 5-325 MG tablet Commonly known as: PERCOCET/ROXICET     TAKE these medications   amLODipine 10 MG tablet Commonly known as: NORVASC Start 1/2 tab PO qd, may increase to 1 PO qd if needed.   aspirin 81 MG chewable tablet Chew 1 tablet (81 mg total) by mouth 2 (two) times daily.   celecoxib 200 MG capsule Commonly known as: CELEBREX Take 1 capsule (200 mg total) by mouth daily.   Magnesium 400 MG Caps Take 400 mg by mouth daily.   methocarbamol 500 MG tablet Commonly known as: ROBAXIN Take 1 tablet (500 mg total) by mouth every 8 (eight) hours as needed for muscle spasms.   multivitamin with minerals tablet Take 1 tablet by mouth daily.   Zinc Oxide 10 % Oint Apply 1 application topically daily as needed (irritation).       Diagnostic Studies: Dg C-arm 1-60 Min  Result Date: 09/04/2018 CLINICAL DATA:  Left hip replacement. FLUOROSCOPY TIME:  37 seconds. Images: 4  EXAM: OPERATIVE LEFT HIP (WITH PELVIS IF PERFORMED) 4 VIEWS TECHNIQUE: Fluoroscopic spot image(s) were submitted for interpretation post-operatively. COMPARISON:  None. FINDINGS: Patient is status post left hip replacement. Hardware is in good position. IMPRESSION: Left hip replacement as above. Electronically Signed   By: Dorise Bullion III M.D   On: 09/04/2018 13:47   Dg Hip Port Unilat With Pelvis 1v Left  Result Date: 09/04/2018 CLINICAL DATA:  Status post left hip replacement EXAM: DG HIP (WITH OR WITHOUT PELVIS) 1V PORT LEFT COMPARISON:  None. FINDINGS: The patient is status post left hip replacement. Acetabular and femoral  components are in good position. Postoperative air seen in the left-sided soft tissues. IMPRESSION: Left hip replacement as above. Electronically Signed   By: Dorise Bullion III M.D   On: 09/04/2018 16:48   Dg Hip Operative Unilat With Pelvis Left  Result Date: 09/04/2018 CLINICAL DATA:  Left hip replacement. FLUOROSCOPY TIME:  37 seconds. Images: 4 EXAM: OPERATIVE LEFT HIP (WITH PELVIS IF PERFORMED) 4 VIEWS TECHNIQUE: Fluoroscopic spot image(s) were submitted for interpretation post-operatively. COMPARISON:  None. FINDINGS: Patient is status post left hip replacement. Hardware is in good position. IMPRESSION: Left hip replacement as above. Electronically Signed   By: Dorise Bullion III M.D   On: 09/04/2018 13:47    Disposition:   Discharge Instructions    Call MD / Call 911   Complete by: As directed    If you experience chest pain or shortness of breath, CALL 911 and be transported to the hospital emergency room.  If you develope a fever above 101 F, pus (white drainage) or increased drainage or redness at the wound, or calf pain, call your surgeon's office.   Constipation Prevention   Complete by: As directed    Drink plenty of fluids.  Prune juice may be helpful.  You may use a stool softener, such as Colace (over the counter) 100 mg twice a day.  Use MiraLax (over the counter) for constipation as needed.   Diet - low sodium heart healthy   Complete by: As directed    Discharge instructions   Complete by: As directed    Continue exercises at home as per PT Okay to shower, dressing is waterproof.  Do not take a bath or soak the dressing in a body of water (such as a pool or lake) Weight-bearing as tolerated with walker   Increase activity slowly as tolerated   Complete by: As directed          Signed: Donella Stade 09/13/2018, 2:22 PM

## 2018-09-14 ENCOUNTER — Ambulatory Visit (INDEPENDENT_AMBULATORY_CARE_PROVIDER_SITE_OTHER): Payer: PRIVATE HEALTH INSURANCE | Admitting: Orthopedic Surgery

## 2018-09-14 ENCOUNTER — Ambulatory Visit (INDEPENDENT_AMBULATORY_CARE_PROVIDER_SITE_OTHER): Payer: PRIVATE HEALTH INSURANCE

## 2018-09-14 ENCOUNTER — Other Ambulatory Visit: Payer: Self-pay

## 2018-09-14 ENCOUNTER — Encounter: Payer: Self-pay | Admitting: Orthopedic Surgery

## 2018-09-14 DIAGNOSIS — Z96642 Presence of left artificial hip joint: Secondary | ICD-10-CM

## 2018-09-14 NOTE — Progress Notes (Signed)
   Post-Op Visit Note   Patient: Charles Benson           Date of Birth: 1960-05-18           MRN: [id number] Visit Date: 09/14/2018 PCP: Patient, No Pcp Per   Assessment & Plan:  Chief Complaint: No chief complaint on file.  Visit Diagnoses:  1. Status post total replacement of left hip     Plan: Pt is doing well s/p L THA on 09/04/18.  He is ambulating with a walker and has begun to wean off the walker somewhat, not leaning on it constantly when walking around home.  He is taking one oxycodone prior to bed, to improve pain for his sleep. Incision is healing well with one raw area in the superior aspect that was covered with a bandaid in the office today.  Pt has even leg length and strong hip flexor strength.  X-rays show prosthesis in appropriate position with no evidence of loosening.  Pt will f/u with the office in 4 weeks.    Follow-Up Instructions: No follow-ups on file.   Orders:  Orders Placed This Encounter  Procedures  . XR HIP UNILAT W OR W/O PELVIS 2-3 VIEWS LEFT   No orders of the defined types were placed in this encounter.   Imaging: No results found.  PMFS History: Patient Active Problem List   Diagnosis Date Noted  . Hip arthritis 09/04/2018  . Essential hypertension 08/31/2018  . 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 Arthritis   . Hypertension     History reviewed. No pertinent family history.  Past Surgical History:  Procedure Laterality Date  . ANTERIOR CERVICAL DECOMP/DISCECTOMY FUSION Bilateral 10/10/2014   Procedure: ANTERIOR CERVICAL DECOMPRESSION/DISCECTOMY CERVICAL FIVE-SIX;  Surgeon: Jovita Gamma, MD;  Location: [place] [facility];  Service: Neurosurgery;  Laterality: Bilateral;  . TONSILLECTOMY    . TOTAL HIP ARTHROPLASTY Left 09/04/2018   Procedure: LEFT TOTAL HIP ARTHROPLASTY ANTERIOR APPROACH;  Surgeon: Meredith Pel, MD;  Location: [place];  Service: Orthopedics;  Laterality: Left;  . WRIST  SURGERY     Social History   Occupational History  . Not on file  Tobacco Use  . Smoking status: Never Smoker  . Smokeless tobacco: Never Used  Substance and Sexual Activity  . Alcohol use: Yes    Comment: occ  . Drug use: No  . Sexual activity: Not on file

## 2018-09-17 ENCOUNTER — Telehealth: Payer: Self-pay

## 2018-09-17 NOTE — Telephone Encounter (Signed)
Faxed note to [facility]

## 2018-09-17 NOTE — Telephone Encounter (Signed)
Oow for 4weeks thx

## 2018-09-17 NOTE — Telephone Encounter (Signed)
Faxed the 09/14/18 office note and next appt info to case mgr per her request

## 2018-09-17 NOTE — Telephone Encounter (Signed)
Please advise on work status. Thanks.  

## 2018-09-17 NOTE — Telephone Encounter (Signed)
Note done

## 2018-09-17 NOTE — Telephone Encounter (Signed)
Please advise on work status.  

## 2018-09-17 NOTE — Telephone Encounter (Signed)
Work comp case mgr is requesting updated work note for patient.

## 2018-09-20 ENCOUNTER — Telehealth: Payer: Self-pay | Admitting: Orthopedic Surgery

## 2018-09-20 NOTE — Telephone Encounter (Signed)
Patient called asked if he can get a note returning back to work 10/01/2018 on light duty no lifting. The number to contact patient is [phone]

## 2018-09-21 NOTE — Telephone Encounter (Signed)
IC advised could pick up at front desk.  

## 2018-09-21 NOTE — Telephone Encounter (Signed)
Ok for this? 

## 2018-09-21 NOTE — Telephone Encounter (Signed)
yes

## 2018-10-01 ENCOUNTER — Ambulatory Visit: Payer: Self-pay | Admitting: Family Medicine

## 2018-10-02 ENCOUNTER — Telehealth: Payer: Self-pay | Admitting: Orthopedic Surgery

## 2018-10-02 NOTE — Telephone Encounter (Signed)
Ok to rf pls claal thx

## 2018-10-02 NOTE — Telephone Encounter (Signed)
Patient called advised he has returned back to work yesterday (light duty) Patient said he need a Rx refilled (Oxycodone) Patient said he uses the [facility] at [employer] on [address].  The number to contact patient is [phone]

## 2018-10-02 NOTE — Telephone Encounter (Signed)
Please advise. Thanks.  

## 2018-10-02 NOTE — Telephone Encounter (Signed)
Can you please submit electronically.  

## 2018-10-03 ENCOUNTER — Other Ambulatory Visit: Payer: Self-pay | Admitting: Surgical

## 2018-10-03 MED ORDER — OXYCODONE HCL 5 MG PO TABS: ORAL_TABLET | ORAL | 0 refills | Status: AC

## 2018-10-03 NOTE — Telephone Encounter (Signed)
IC patient advised done.  

## 2018-10-15 ENCOUNTER — Ambulatory Visit (INDEPENDENT_AMBULATORY_CARE_PROVIDER_SITE_OTHER): Payer: PRIVATE HEALTH INSURANCE | Admitting: Orthopedic Surgery

## 2018-10-15 ENCOUNTER — Encounter: Payer: Self-pay | Admitting: Orthopedic Surgery

## 2018-10-15 DIAGNOSIS — Z96642 Presence of left artificial hip joint: Secondary | ICD-10-CM

## 2018-10-15 NOTE — Progress Notes (Signed)
   Post-Op Visit Note   Patient: Charles Benson           Date of Birth: [birth year]           MRN: [id number] Visit Date: 10/15/2018 PCP: Patient, No Pcp Per   Assessment & Plan:  Chief Complaint:  Chief Complaint  Patient presents with  . Left Hip - Routine Post Op   Visit Diagnoses:  1. Status post total replacement of left hip     Plan: Charles Benson is a patient is now about 6 weeks out left total hip replacement.  Is doing well.  On exam he has good hip flexion strength and normal gait.  Slightly antalgic to the left but overall looks good.  He has been back at work 2 weeks.  Taking oxycodone only occasionally but Celebrex does help him with stiffness in the morning plan at this time is to continue with walking.  I will see him back in 6 weeks for final check.  Follow-Up Instructions: Return in about 6 weeks (around 11/26/2018).   Orders:  No orders of the defined types were placed in this encounter.  No orders of the defined types were placed in this encounter.   Imaging: No results found.  PMFS History: Patient Active Problem List   Diagnosis Date Noted  . Hip arthritis 09/04/2018  . Essential hypertension 08/31/2018  . 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 Arthritis   . Hypertension     History reviewed. No pertinent family history.  Past Surgical History:  Procedure Laterality Date  . ANTERIOR CERVICAL DECOMP/DISCECTOMY FUSION Bilateral 10/10/2014   Procedure: ANTERIOR CERVICAL DECOMPRESSION/DISCECTOMY CERVICAL FIVE-SIX;  Surgeon: Jovita Gamma, MD;  Location: [place] [facility];  Service: Neurosurgery;  Laterality: Bilateral;  . TONSILLECTOMY    . TOTAL HIP ARTHROPLASTY Left 09/04/2018   Procedure: LEFT TOTAL HIP ARTHROPLASTY ANTERIOR APPROACH;  Surgeon: Meredith Pel, MD;  Location: [place];  Service: Orthopedics;  Laterality: Left;  . WRIST SURGERY     Social History   Occupational History  . Not on file  Tobacco  Use  . Smoking status: Never Smoker  . Smokeless tobacco: Never Used  Substance and Sexual Activity  . Alcohol use: Yes    Comment: occ  . Drug use: No  . Sexual activity: Not on file

## 2018-10-16 ENCOUNTER — Telehealth: Payer: Self-pay

## 2018-10-16 NOTE — Telephone Encounter (Signed)
Patient seen yesterday. Please advise work status. Thanks.

## 2018-10-16 NOTE — Telephone Encounter (Signed)
Case mgr is requesting an updated work note for patient

## 2018-10-16 NOTE — Telephone Encounter (Signed)
Pt has been working  pls call him to confirm ok to rtw no restrictions thx

## 2018-10-16 NOTE — Telephone Encounter (Signed)
Emailed note to [email or web address]

## 2018-10-16 NOTE — Telephone Encounter (Signed)
See note from Dr Marlou Sa I generated updated work note for patient.

## 2018-11-26 ENCOUNTER — Encounter: Payer: Self-pay | Admitting: Orthopedic Surgery

## 2018-11-26 ENCOUNTER — Ambulatory Visit (INDEPENDENT_AMBULATORY_CARE_PROVIDER_SITE_OTHER): Payer: PRIVATE HEALTH INSURANCE | Admitting: Orthopedic Surgery

## 2018-11-26 DIAGNOSIS — Z96642 Presence of left artificial hip joint: Secondary | ICD-10-CM

## 2018-11-26 NOTE — Progress Notes (Signed)
   Post-Op Visit Note   Patient: Charles Benson           Date of Birth: [birth year]           MRN: [id number] Visit Date: 11/26/2018 PCP: Patient, No Pcp Per   Assessment & Plan:  Chief Complaint:  Chief Complaint  Patient presents with  . Follow-up   Visit Diagnoses:  1. Status post total replacement of left hip     Plan: [name] is a 58 year old patient 3 months out left total hip replacement.  Is been doing well.  He is back at work.  On exam he has excellent strength and normal gait.  Still feels like the hip is a little bit weak.  I am releasing him at this time.  I think he has a good result with his hip replacement.  Based on AMA guideline 6 addition permanent partial disability for the lower extremities he has a good result with his hip replacement and based on the table in page 515 his lower extremity impairment is 25% of the lower extremity status post hip replacement.  He will follow-up as needed.  Need for prophylaxis before any procedures with antibiotics is discussed  Follow-Up Instructions: Return if symptoms worsen or fail to improve.   Orders:  No orders of the defined types were placed in this encounter.  No orders of the defined types were placed in this encounter.   Imaging: No results found.  PMFS History: Patient Active Problem List   Diagnosis Date Noted  . Hip arthritis 09/04/2018  . Essential hypertension 08/31/2018  . 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 Arthritis   . Hypertension     History reviewed. No pertinent family history.  Past Surgical History:  Procedure Laterality Date  . ANTERIOR CERVICAL DECOMP/DISCECTOMY FUSION Bilateral 10/10/2014   Procedure: ANTERIOR CERVICAL DECOMPRESSION/DISCECTOMY CERVICAL FIVE-SIX;  Surgeon: Jovita Gamma, MD;  Location: [place] [facility];  Service: Neurosurgery;  Laterality: Bilateral;  . TONSILLECTOMY    . TOTAL HIP ARTHROPLASTY Left 09/04/2018   Procedure: LEFT  TOTAL HIP ARTHROPLASTY ANTERIOR APPROACH;  Surgeon: Meredith Pel, MD;  Location: [place];  Service: Orthopedics;  Laterality: Left;  . WRIST SURGERY     Social History   Occupational History  . Not on file  Tobacco Use  . Smoking status: Never Smoker  . Smokeless tobacco: Never Used  Substance and Sexual Activity  . Alcohol use: Yes    Comment: occ  . Drug use: No  . Sexual activity: Not on file

## 2018-11-28 ENCOUNTER — Telehealth: Payer: Self-pay

## 2018-11-28 NOTE — Telephone Encounter (Signed)
Faxed 11/26/18 office note to [facility] per her request

## 2019-03-16 ENCOUNTER — Other Ambulatory Visit: Payer: Self-pay | Admitting: Surgical
